# Patient Record
Sex: Male | Born: 1945 | Race: Black or African American | Hispanic: No | Marital: Married | Smoking: Former smoker
Health system: Southern US, Community
[De-identification: ages and names within clinical notes are randomized; demographics above are authoritative.]

## PROBLEM LIST (undated history)

## (undated) DIAGNOSIS — I10 Essential (primary) hypertension: Secondary | ICD-10-CM

## (undated) DIAGNOSIS — E119 Type 2 diabetes mellitus without complications: Secondary | ICD-10-CM

## (undated) DIAGNOSIS — F4310 Post-traumatic stress disorder, unspecified: Secondary | ICD-10-CM

## (undated) DIAGNOSIS — I4891 Unspecified atrial fibrillation: Secondary | ICD-10-CM

## (undated) DIAGNOSIS — N529 Male erectile dysfunction, unspecified: Secondary | ICD-10-CM

## (undated) DIAGNOSIS — R911 Solitary pulmonary nodule: Secondary | ICD-10-CM

## (undated) HISTORY — DX: Post-traumatic stress disorder, unspecified: F43.10

## (undated) HISTORY — DX: Type 2 diabetes mellitus without complications: E11.9

## (undated) HISTORY — DX: Essential (primary) hypertension: I10

## (undated) HISTORY — DX: Unspecified atrial fibrillation: I48.91

## (undated) HISTORY — DX: Male erectile dysfunction, unspecified: N52.9

## (undated) HISTORY — DX: Solitary pulmonary nodule: R91.1

---

## 2016-06-21 DIAGNOSIS — R739 Hyperglycemia, unspecified: Secondary | ICD-10-CM | POA: Diagnosis not present

## 2016-06-21 DIAGNOSIS — J209 Acute bronchitis, unspecified: Secondary | ICD-10-CM | POA: Diagnosis not present

## 2016-06-21 DIAGNOSIS — R0600 Dyspnea, unspecified: Secondary | ICD-10-CM | POA: Diagnosis not present

## 2016-06-21 DIAGNOSIS — I10 Essential (primary) hypertension: Secondary | ICD-10-CM | POA: Diagnosis not present

## 2016-06-21 DIAGNOSIS — E871 Hypo-osmolality and hyponatremia: Secondary | ICD-10-CM | POA: Diagnosis not present

## 2016-06-21 DIAGNOSIS — E119 Type 2 diabetes mellitus without complications: Secondary | ICD-10-CM | POA: Diagnosis not present

## 2016-06-21 DIAGNOSIS — Z0131 Encounter for examination of blood pressure with abnormal findings: Secondary | ICD-10-CM | POA: Diagnosis not present

## 2016-06-21 DIAGNOSIS — I4891 Unspecified atrial fibrillation: Secondary | ICD-10-CM | POA: Diagnosis not present

## 2016-06-21 DIAGNOSIS — Z7901 Long term (current) use of anticoagulants: Secondary | ICD-10-CM | POA: Diagnosis not present

## 2016-06-21 DIAGNOSIS — J019 Acute sinusitis, unspecified: Secondary | ICD-10-CM | POA: Diagnosis not present

## 2016-06-21 DIAGNOSIS — R350 Frequency of micturition: Secondary | ICD-10-CM | POA: Diagnosis not present

## 2016-06-21 DIAGNOSIS — Z7984 Long term (current) use of oral hypoglycemic drugs: Secondary | ICD-10-CM | POA: Diagnosis not present

## 2016-06-21 DIAGNOSIS — R000 Tachycardia, unspecified: Secondary | ICD-10-CM | POA: Diagnosis not present

## 2016-06-21 DIAGNOSIS — Z79899 Other long term (current) drug therapy: Secondary | ICD-10-CM | POA: Diagnosis not present

## 2016-06-21 DIAGNOSIS — R0602 Shortness of breath: Secondary | ICD-10-CM | POA: Diagnosis not present

## 2016-12-06 DIAGNOSIS — I10 Essential (primary) hypertension: Secondary | ICD-10-CM | POA: Diagnosis not present

## 2016-12-06 DIAGNOSIS — E109 Type 1 diabetes mellitus without complications: Secondary | ICD-10-CM | POA: Diagnosis not present

## 2016-12-06 DIAGNOSIS — R05 Cough: Secondary | ICD-10-CM | POA: Diagnosis not present

## 2017-01-03 DIAGNOSIS — I4891 Unspecified atrial fibrillation: Secondary | ICD-10-CM | POA: Diagnosis not present

## 2017-01-03 DIAGNOSIS — R05 Cough: Secondary | ICD-10-CM | POA: Diagnosis not present

## 2017-01-03 DIAGNOSIS — I10 Essential (primary) hypertension: Secondary | ICD-10-CM | POA: Diagnosis not present

## 2017-01-03 DIAGNOSIS — K219 Gastro-esophageal reflux disease without esophagitis: Secondary | ICD-10-CM | POA: Diagnosis not present

## 2017-01-03 DIAGNOSIS — E1142 Type 2 diabetes mellitus with diabetic polyneuropathy: Secondary | ICD-10-CM | POA: Diagnosis not present

## 2017-01-07 DIAGNOSIS — R1310 Dysphagia, unspecified: Secondary | ICD-10-CM | POA: Diagnosis not present

## 2017-01-07 DIAGNOSIS — R05 Cough: Secondary | ICD-10-CM | POA: Diagnosis not present

## 2017-01-18 DIAGNOSIS — K2900 Acute gastritis without bleeding: Secondary | ICD-10-CM | POA: Diagnosis not present

## 2017-01-18 DIAGNOSIS — I4891 Unspecified atrial fibrillation: Secondary | ICD-10-CM | POA: Diagnosis not present

## 2017-01-18 DIAGNOSIS — R499 Unspecified voice and resonance disorder: Secondary | ICD-10-CM | POA: Diagnosis not present

## 2017-01-18 DIAGNOSIS — R1310 Dysphagia, unspecified: Secondary | ICD-10-CM | POA: Diagnosis not present

## 2017-01-18 DIAGNOSIS — R05 Cough: Secondary | ICD-10-CM | POA: Diagnosis not present

## 2017-01-18 DIAGNOSIS — I10 Essential (primary) hypertension: Secondary | ICD-10-CM | POA: Diagnosis not present

## 2017-01-18 DIAGNOSIS — Z888 Allergy status to other drugs, medicaments and biological substances status: Secondary | ICD-10-CM | POA: Diagnosis not present

## 2017-01-18 DIAGNOSIS — K2950 Unspecified chronic gastritis without bleeding: Secondary | ICD-10-CM | POA: Diagnosis not present

## 2017-01-18 DIAGNOSIS — E119 Type 2 diabetes mellitus without complications: Secondary | ICD-10-CM | POA: Diagnosis not present

## 2017-01-18 DIAGNOSIS — K117 Disturbances of salivary secretion: Secondary | ICD-10-CM | POA: Diagnosis not present

## 2017-01-18 DIAGNOSIS — Z79899 Other long term (current) drug therapy: Secondary | ICD-10-CM | POA: Diagnosis not present

## 2017-01-18 DIAGNOSIS — K219 Gastro-esophageal reflux disease without esophagitis: Secondary | ICD-10-CM | POA: Diagnosis not present

## 2017-01-18 DIAGNOSIS — R062 Wheezing: Secondary | ICD-10-CM | POA: Diagnosis not present

## 2017-01-18 DIAGNOSIS — Z7902 Long term (current) use of antithrombotics/antiplatelets: Secondary | ICD-10-CM | POA: Diagnosis not present

## 2017-01-18 DIAGNOSIS — K2970 Gastritis, unspecified, without bleeding: Secondary | ICD-10-CM | POA: Diagnosis not present

## 2017-01-18 DIAGNOSIS — Z794 Long term (current) use of insulin: Secondary | ICD-10-CM | POA: Diagnosis not present

## 2017-02-04 DIAGNOSIS — R05 Cough: Secondary | ICD-10-CM | POA: Diagnosis not present

## 2017-02-04 DIAGNOSIS — K2900 Acute gastritis without bleeding: Secondary | ICD-10-CM | POA: Diagnosis not present

## 2017-02-12 DIAGNOSIS — G4730 Sleep apnea, unspecified: Secondary | ICD-10-CM | POA: Diagnosis not present

## 2017-02-12 DIAGNOSIS — I4892 Unspecified atrial flutter: Secondary | ICD-10-CM | POA: Diagnosis not present

## 2017-02-12 DIAGNOSIS — E119 Type 2 diabetes mellitus without complications: Secondary | ICD-10-CM | POA: Diagnosis not present

## 2017-02-12 DIAGNOSIS — I10 Essential (primary) hypertension: Secondary | ICD-10-CM | POA: Diagnosis not present

## 2017-02-12 DIAGNOSIS — T50905S Adverse effect of unspecified drugs, medicaments and biological substances, sequela: Secondary | ICD-10-CM | POA: Diagnosis not present

## 2017-02-12 DIAGNOSIS — Z7901 Long term (current) use of anticoagulants: Secondary | ICD-10-CM | POA: Diagnosis not present

## 2017-02-14 DIAGNOSIS — R05 Cough: Secondary | ICD-10-CM | POA: Diagnosis not present

## 2017-02-20 DIAGNOSIS — I4892 Unspecified atrial flutter: Secondary | ICD-10-CM | POA: Diagnosis not present

## 2017-02-20 DIAGNOSIS — I10 Essential (primary) hypertension: Secondary | ICD-10-CM | POA: Diagnosis not present

## 2017-03-06 DIAGNOSIS — I10 Essential (primary) hypertension: Secondary | ICD-10-CM | POA: Diagnosis not present

## 2017-03-12 DIAGNOSIS — I4891 Unspecified atrial fibrillation: Secondary | ICD-10-CM | POA: Diagnosis not present

## 2017-03-12 DIAGNOSIS — E1142 Type 2 diabetes mellitus with diabetic polyneuropathy: Secondary | ICD-10-CM | POA: Diagnosis not present

## 2017-03-12 DIAGNOSIS — I10 Essential (primary) hypertension: Secondary | ICD-10-CM | POA: Diagnosis not present

## 2017-03-29 DIAGNOSIS — R9431 Abnormal electrocardiogram [ECG] [EKG]: Secondary | ICD-10-CM | POA: Diagnosis not present

## 2017-03-29 DIAGNOSIS — I4891 Unspecified atrial fibrillation: Secondary | ICD-10-CM | POA: Diagnosis not present

## 2017-03-29 DIAGNOSIS — I10 Essential (primary) hypertension: Secondary | ICD-10-CM | POA: Diagnosis not present

## 2017-03-29 DIAGNOSIS — R002 Palpitations: Secondary | ICD-10-CM | POA: Diagnosis not present

## 2017-03-29 DIAGNOSIS — R600 Localized edema: Secondary | ICD-10-CM | POA: Diagnosis not present

## 2017-03-29 DIAGNOSIS — I4892 Unspecified atrial flutter: Secondary | ICD-10-CM | POA: Diagnosis not present

## 2017-03-29 DIAGNOSIS — F329 Major depressive disorder, single episode, unspecified: Secondary | ICD-10-CM | POA: Diagnosis not present

## 2017-03-29 DIAGNOSIS — I4439 Other atrioventricular block: Secondary | ICD-10-CM | POA: Diagnosis not present

## 2017-04-11 DIAGNOSIS — R351 Nocturia: Secondary | ICD-10-CM | POA: Diagnosis not present

## 2017-04-11 DIAGNOSIS — E119 Type 2 diabetes mellitus without complications: Secondary | ICD-10-CM | POA: Diagnosis not present

## 2017-04-11 DIAGNOSIS — J209 Acute bronchitis, unspecified: Secondary | ICD-10-CM | POA: Diagnosis not present

## 2017-04-11 DIAGNOSIS — R490 Dysphonia: Secondary | ICD-10-CM | POA: Diagnosis not present

## 2017-04-11 DIAGNOSIS — J019 Acute sinusitis, unspecified: Secondary | ICD-10-CM | POA: Diagnosis not present

## 2017-04-11 DIAGNOSIS — I10 Essential (primary) hypertension: Secondary | ICD-10-CM | POA: Diagnosis not present

## 2017-05-08 DIAGNOSIS — K219 Gastro-esophageal reflux disease without esophagitis: Secondary | ICD-10-CM | POA: Diagnosis not present

## 2017-05-08 DIAGNOSIS — G4733 Obstructive sleep apnea (adult) (pediatric): Secondary | ICD-10-CM | POA: Diagnosis not present

## 2017-05-24 DIAGNOSIS — I4892 Unspecified atrial flutter: Secondary | ICD-10-CM | POA: Diagnosis not present

## 2017-05-24 DIAGNOSIS — Z79899 Other long term (current) drug therapy: Secondary | ICD-10-CM | POA: Diagnosis not present

## 2017-05-24 DIAGNOSIS — Z01818 Encounter for other preprocedural examination: Secondary | ICD-10-CM | POA: Diagnosis not present

## 2017-05-29 DIAGNOSIS — Z794 Long term (current) use of insulin: Secondary | ICD-10-CM | POA: Diagnosis not present

## 2017-05-29 DIAGNOSIS — E119 Type 2 diabetes mellitus without complications: Secondary | ICD-10-CM | POA: Diagnosis not present

## 2017-05-29 DIAGNOSIS — Z6838 Body mass index (BMI) 38.0-38.9, adult: Secondary | ICD-10-CM | POA: Diagnosis not present

## 2017-05-29 DIAGNOSIS — E669 Obesity, unspecified: Secondary | ICD-10-CM | POA: Diagnosis not present

## 2017-05-29 DIAGNOSIS — I97618 Postprocedural hemorrhage and hematoma of a circulatory system organ or structure following other circulatory system procedure: Secondary | ICD-10-CM | POA: Diagnosis not present

## 2017-05-29 DIAGNOSIS — I483 Typical atrial flutter: Secondary | ICD-10-CM | POA: Diagnosis not present

## 2017-05-29 DIAGNOSIS — I4892 Unspecified atrial flutter: Secondary | ICD-10-CM | POA: Diagnosis not present

## 2017-05-29 DIAGNOSIS — I10 Essential (primary) hypertension: Secondary | ICD-10-CM | POA: Diagnosis not present

## 2017-05-29 DIAGNOSIS — F329 Major depressive disorder, single episode, unspecified: Secondary | ICD-10-CM | POA: Diagnosis not present

## 2017-05-29 DIAGNOSIS — Z87891 Personal history of nicotine dependence: Secondary | ICD-10-CM | POA: Diagnosis not present

## 2017-05-29 DIAGNOSIS — I4891 Unspecified atrial fibrillation: Secondary | ICD-10-CM | POA: Diagnosis not present

## 2017-05-29 DIAGNOSIS — G4730 Sleep apnea, unspecified: Secondary | ICD-10-CM | POA: Diagnosis not present

## 2017-05-29 DIAGNOSIS — Z7901 Long term (current) use of anticoagulants: Secondary | ICD-10-CM | POA: Diagnosis not present

## 2017-05-29 DIAGNOSIS — R9431 Abnormal electrocardiogram [ECG] [EKG]: Secondary | ICD-10-CM | POA: Diagnosis not present

## 2017-05-30 DIAGNOSIS — I4891 Unspecified atrial fibrillation: Secondary | ICD-10-CM | POA: Diagnosis not present

## 2017-05-30 DIAGNOSIS — F329 Major depressive disorder, single episode, unspecified: Secondary | ICD-10-CM | POA: Diagnosis not present

## 2017-05-30 DIAGNOSIS — Z7901 Long term (current) use of anticoagulants: Secondary | ICD-10-CM | POA: Diagnosis not present

## 2017-05-30 DIAGNOSIS — I10 Essential (primary) hypertension: Secondary | ICD-10-CM | POA: Diagnosis not present

## 2017-05-30 DIAGNOSIS — I97618 Postprocedural hemorrhage and hematoma of a circulatory system organ or structure following other circulatory system procedure: Secondary | ICD-10-CM | POA: Diagnosis not present

## 2017-05-30 DIAGNOSIS — I483 Typical atrial flutter: Secondary | ICD-10-CM | POA: Diagnosis not present

## 2017-06-01 DIAGNOSIS — I4891 Unspecified atrial fibrillation: Secondary | ICD-10-CM | POA: Diagnosis not present

## 2017-06-01 DIAGNOSIS — Z9889 Other specified postprocedural states: Secondary | ICD-10-CM | POA: Diagnosis not present

## 2017-06-01 DIAGNOSIS — R945 Abnormal results of liver function studies: Secondary | ICD-10-CM | POA: Diagnosis not present

## 2017-06-01 DIAGNOSIS — R911 Solitary pulmonary nodule: Secondary | ICD-10-CM | POA: Diagnosis not present

## 2017-06-01 DIAGNOSIS — Z7901 Long term (current) use of anticoagulants: Secondary | ICD-10-CM | POA: Diagnosis not present

## 2017-06-01 DIAGNOSIS — I10 Essential (primary) hypertension: Secondary | ICD-10-CM | POA: Diagnosis not present

## 2017-06-01 DIAGNOSIS — E119 Type 2 diabetes mellitus without complications: Secondary | ICD-10-CM | POA: Diagnosis not present

## 2017-06-01 DIAGNOSIS — Z794 Long term (current) use of insulin: Secondary | ICD-10-CM | POA: Diagnosis not present

## 2017-06-01 DIAGNOSIS — R140 Abdominal distension (gaseous): Secondary | ICD-10-CM | POA: Diagnosis not present

## 2017-06-01 DIAGNOSIS — R7989 Other specified abnormal findings of blood chemistry: Secondary | ICD-10-CM | POA: Diagnosis not present

## 2017-06-01 DIAGNOSIS — R062 Wheezing: Secondary | ICD-10-CM | POA: Diagnosis not present

## 2017-06-01 DIAGNOSIS — K5730 Diverticulosis of large intestine without perforation or abscess without bleeding: Secondary | ICD-10-CM | POA: Diagnosis not present

## 2017-06-01 DIAGNOSIS — R918 Other nonspecific abnormal finding of lung field: Secondary | ICD-10-CM | POA: Diagnosis not present

## 2017-06-01 DIAGNOSIS — R0602 Shortness of breath: Secondary | ICD-10-CM | POA: Diagnosis not present

## 2017-06-01 DIAGNOSIS — Z79899 Other long term (current) drug therapy: Secondary | ICD-10-CM | POA: Diagnosis not present

## 2017-06-05 DIAGNOSIS — I10 Essential (primary) hypertension: Secondary | ICD-10-CM | POA: Diagnosis not present

## 2017-06-05 DIAGNOSIS — Z7901 Long term (current) use of anticoagulants: Secondary | ICD-10-CM | POA: Diagnosis not present

## 2017-06-05 DIAGNOSIS — I4891 Unspecified atrial fibrillation: Secondary | ICD-10-CM | POA: Diagnosis not present

## 2017-06-05 DIAGNOSIS — I4892 Unspecified atrial flutter: Secondary | ICD-10-CM | POA: Diagnosis not present

## 2017-06-06 DIAGNOSIS — K224 Dyskinesia of esophagus: Secondary | ICD-10-CM | POA: Diagnosis not present

## 2017-06-06 DIAGNOSIS — K219 Gastro-esophageal reflux disease without esophagitis: Secondary | ICD-10-CM | POA: Diagnosis not present

## 2017-07-02 DIAGNOSIS — R490 Dysphonia: Secondary | ICD-10-CM | POA: Diagnosis not present

## 2017-07-08 DIAGNOSIS — Z7901 Long term (current) use of anticoagulants: Secondary | ICD-10-CM | POA: Diagnosis not present

## 2017-07-08 DIAGNOSIS — I4891 Unspecified atrial fibrillation: Secondary | ICD-10-CM | POA: Diagnosis not present

## 2017-07-08 DIAGNOSIS — G4730 Sleep apnea, unspecified: Secondary | ICD-10-CM | POA: Diagnosis not present

## 2017-07-08 DIAGNOSIS — I4892 Unspecified atrial flutter: Secondary | ICD-10-CM | POA: Diagnosis not present

## 2017-07-08 DIAGNOSIS — I10 Essential (primary) hypertension: Secondary | ICD-10-CM | POA: Diagnosis not present

## 2017-07-15 DIAGNOSIS — R490 Dysphonia: Secondary | ICD-10-CM | POA: Diagnosis not present

## 2017-07-15 DIAGNOSIS — J358 Other chronic diseases of tonsils and adenoids: Secondary | ICD-10-CM | POA: Diagnosis not present

## 2017-07-30 DIAGNOSIS — I10 Essential (primary) hypertension: Secondary | ICD-10-CM | POA: Diagnosis not present

## 2017-07-30 DIAGNOSIS — F4310 Post-traumatic stress disorder, unspecified: Secondary | ICD-10-CM | POA: Diagnosis not present

## 2017-07-30 DIAGNOSIS — R911 Solitary pulmonary nodule: Secondary | ICD-10-CM | POA: Diagnosis not present

## 2017-07-30 DIAGNOSIS — N529 Male erectile dysfunction, unspecified: Secondary | ICD-10-CM | POA: Diagnosis not present

## 2017-07-30 DIAGNOSIS — Z6838 Body mass index (BMI) 38.0-38.9, adult: Secondary | ICD-10-CM | POA: Diagnosis not present

## 2017-07-30 DIAGNOSIS — E119 Type 2 diabetes mellitus without complications: Secondary | ICD-10-CM | POA: Diagnosis not present

## 2017-07-30 DIAGNOSIS — I4891 Unspecified atrial fibrillation: Secondary | ICD-10-CM | POA: Diagnosis not present

## 2017-07-30 DIAGNOSIS — Z299 Encounter for prophylactic measures, unspecified: Secondary | ICD-10-CM | POA: Diagnosis not present

## 2017-08-21 DIAGNOSIS — I4891 Unspecified atrial fibrillation: Secondary | ICD-10-CM | POA: Diagnosis not present

## 2017-08-21 DIAGNOSIS — Z299 Encounter for prophylactic measures, unspecified: Secondary | ICD-10-CM | POA: Diagnosis not present

## 2017-08-21 DIAGNOSIS — E1165 Type 2 diabetes mellitus with hyperglycemia: Secondary | ICD-10-CM | POA: Diagnosis not present

## 2017-08-21 DIAGNOSIS — Z6838 Body mass index (BMI) 38.0-38.9, adult: Secondary | ICD-10-CM | POA: Diagnosis not present

## 2017-08-21 DIAGNOSIS — L259 Unspecified contact dermatitis, unspecified cause: Secondary | ICD-10-CM | POA: Diagnosis not present

## 2017-09-06 DIAGNOSIS — J3489 Other specified disorders of nose and nasal sinuses: Secondary | ICD-10-CM | POA: Diagnosis not present

## 2017-09-06 DIAGNOSIS — I4891 Unspecified atrial fibrillation: Secondary | ICD-10-CM | POA: Diagnosis not present

## 2017-09-06 DIAGNOSIS — E1165 Type 2 diabetes mellitus with hyperglycemia: Secondary | ICD-10-CM | POA: Diagnosis not present

## 2017-09-06 DIAGNOSIS — Z713 Dietary counseling and surveillance: Secondary | ICD-10-CM | POA: Diagnosis not present

## 2017-09-06 DIAGNOSIS — Z299 Encounter for prophylactic measures, unspecified: Secondary | ICD-10-CM | POA: Diagnosis not present

## 2017-10-01 DIAGNOSIS — Z299 Encounter for prophylactic measures, unspecified: Secondary | ICD-10-CM | POA: Diagnosis not present

## 2017-10-01 DIAGNOSIS — Z6837 Body mass index (BMI) 37.0-37.9, adult: Secondary | ICD-10-CM | POA: Diagnosis not present

## 2017-10-01 DIAGNOSIS — I4891 Unspecified atrial fibrillation: Secondary | ICD-10-CM | POA: Diagnosis not present

## 2017-10-01 DIAGNOSIS — D2339 Other benign neoplasm of skin of other parts of face: Secondary | ICD-10-CM | POA: Diagnosis not present

## 2017-10-01 DIAGNOSIS — E1165 Type 2 diabetes mellitus with hyperglycemia: Secondary | ICD-10-CM | POA: Diagnosis not present

## 2017-10-01 DIAGNOSIS — I10 Essential (primary) hypertension: Secondary | ICD-10-CM | POA: Diagnosis not present

## 2017-10-09 DIAGNOSIS — E119 Type 2 diabetes mellitus without complications: Secondary | ICD-10-CM | POA: Diagnosis not present

## 2017-10-09 DIAGNOSIS — T50905S Adverse effect of unspecified drugs, medicaments and biological substances, sequela: Secondary | ICD-10-CM | POA: Diagnosis not present

## 2017-10-09 DIAGNOSIS — I340 Nonrheumatic mitral (valve) insufficiency: Secondary | ICD-10-CM | POA: Diagnosis not present

## 2017-10-09 DIAGNOSIS — M79674 Pain in right toe(s): Secondary | ICD-10-CM | POA: Diagnosis not present

## 2017-10-09 DIAGNOSIS — G4730 Sleep apnea, unspecified: Secondary | ICD-10-CM | POA: Diagnosis not present

## 2017-10-09 DIAGNOSIS — Z7901 Long term (current) use of anticoagulants: Secondary | ICD-10-CM | POA: Diagnosis not present

## 2017-10-09 DIAGNOSIS — S91104A Unspecified open wound of right lesser toe(s) without damage to nail, initial encounter: Secondary | ICD-10-CM | POA: Diagnosis not present

## 2017-10-09 DIAGNOSIS — Z6837 Body mass index (BMI) 37.0-37.9, adult: Secondary | ICD-10-CM | POA: Diagnosis not present

## 2017-10-09 DIAGNOSIS — I4892 Unspecified atrial flutter: Secondary | ICD-10-CM | POA: Diagnosis not present

## 2017-10-09 DIAGNOSIS — I10 Essential (primary) hypertension: Secondary | ICD-10-CM | POA: Diagnosis not present

## 2017-10-16 DIAGNOSIS — L711 Rhinophyma: Secondary | ICD-10-CM | POA: Diagnosis not present

## 2017-10-16 DIAGNOSIS — M950 Acquired deformity of nose: Secondary | ICD-10-CM | POA: Diagnosis not present

## 2018-01-11 DIAGNOSIS — R319 Hematuria, unspecified: Secondary | ICD-10-CM | POA: Diagnosis not present

## 2018-01-28 ENCOUNTER — Ambulatory Visit: Payer: Self-pay

## 2018-01-28 ENCOUNTER — Other Ambulatory Visit: Payer: Self-pay | Admitting: Occupational Medicine

## 2018-01-28 DIAGNOSIS — Z0000 Encounter for general adult medical examination without abnormal findings: Secondary | ICD-10-CM

## 2018-03-11 DIAGNOSIS — Z7189 Other specified counseling: Secondary | ICD-10-CM | POA: Diagnosis not present

## 2018-03-11 DIAGNOSIS — I4891 Unspecified atrial fibrillation: Secondary | ICD-10-CM | POA: Diagnosis not present

## 2018-03-11 DIAGNOSIS — N529 Male erectile dysfunction, unspecified: Secondary | ICD-10-CM | POA: Diagnosis not present

## 2018-03-11 DIAGNOSIS — Z0000 Encounter for general adult medical examination without abnormal findings: Secondary | ICD-10-CM | POA: Diagnosis not present

## 2018-03-11 DIAGNOSIS — Z1211 Encounter for screening for malignant neoplasm of colon: Secondary | ICD-10-CM | POA: Diagnosis not present

## 2018-03-11 DIAGNOSIS — Z1339 Encounter for screening examination for other mental health and behavioral disorders: Secondary | ICD-10-CM | POA: Diagnosis not present

## 2018-03-11 DIAGNOSIS — E119 Type 2 diabetes mellitus without complications: Secondary | ICD-10-CM | POA: Diagnosis not present

## 2018-03-11 DIAGNOSIS — I10 Essential (primary) hypertension: Secondary | ICD-10-CM | POA: Diagnosis not present

## 2018-03-11 DIAGNOSIS — Z1331 Encounter for screening for depression: Secondary | ICD-10-CM | POA: Diagnosis not present

## 2018-03-11 DIAGNOSIS — Z299 Encounter for prophylactic measures, unspecified: Secondary | ICD-10-CM | POA: Diagnosis not present

## 2018-03-11 DIAGNOSIS — Z6837 Body mass index (BMI) 37.0-37.9, adult: Secondary | ICD-10-CM | POA: Diagnosis not present

## 2018-04-03 DIAGNOSIS — Z7901 Long term (current) use of anticoagulants: Secondary | ICD-10-CM | POA: Diagnosis not present

## 2018-04-03 DIAGNOSIS — E119 Type 2 diabetes mellitus without complications: Secondary | ICD-10-CM | POA: Diagnosis not present

## 2018-04-03 DIAGNOSIS — G4730 Sleep apnea, unspecified: Secondary | ICD-10-CM | POA: Diagnosis not present

## 2018-04-03 DIAGNOSIS — I10 Essential (primary) hypertension: Secondary | ICD-10-CM | POA: Diagnosis not present

## 2018-04-03 DIAGNOSIS — Z6837 Body mass index (BMI) 37.0-37.9, adult: Secondary | ICD-10-CM | POA: Diagnosis not present

## 2018-04-03 DIAGNOSIS — I340 Nonrheumatic mitral (valve) insufficiency: Secondary | ICD-10-CM | POA: Diagnosis not present

## 2018-04-03 DIAGNOSIS — T50905S Adverse effect of unspecified drugs, medicaments and biological substances, sequela: Secondary | ICD-10-CM | POA: Diagnosis not present

## 2018-04-03 DIAGNOSIS — I4892 Unspecified atrial flutter: Secondary | ICD-10-CM | POA: Diagnosis not present

## 2018-07-03 DIAGNOSIS — I10 Essential (primary) hypertension: Secondary | ICD-10-CM | POA: Diagnosis not present

## 2018-07-03 DIAGNOSIS — Z6838 Body mass index (BMI) 38.0-38.9, adult: Secondary | ICD-10-CM | POA: Diagnosis not present

## 2018-07-03 DIAGNOSIS — R911 Solitary pulmonary nodule: Secondary | ICD-10-CM | POA: Diagnosis not present

## 2018-07-03 DIAGNOSIS — Z299 Encounter for prophylactic measures, unspecified: Secondary | ICD-10-CM | POA: Diagnosis not present

## 2018-07-03 DIAGNOSIS — E1165 Type 2 diabetes mellitus with hyperglycemia: Secondary | ICD-10-CM | POA: Diagnosis not present

## 2018-07-03 DIAGNOSIS — G4730 Sleep apnea, unspecified: Secondary | ICD-10-CM | POA: Diagnosis not present

## 2018-07-03 DIAGNOSIS — R51 Headache: Secondary | ICD-10-CM | POA: Diagnosis not present

## 2018-08-21 ENCOUNTER — Telehealth: Payer: Self-pay | Admitting: Neurology

## 2018-08-21 NOTE — Telephone Encounter (Signed)
Called the patient to inform them that our office has placed new protocols in place for our office visits. Due to Covid 19 our office is reducing our number of office visits in order to minimize the risk to our patients and healthcare providers.Our office is now providing the capability to offer the patients virtual visits at this time. Thre was no answer. LVM informing the pt to call back.

## 2018-08-27 ENCOUNTER — Institutional Professional Consult (permissible substitution): Payer: Self-pay | Admitting: Neurology

## 2018-09-17 DIAGNOSIS — Z6838 Body mass index (BMI) 38.0-38.9, adult: Secondary | ICD-10-CM | POA: Diagnosis not present

## 2018-09-17 DIAGNOSIS — I10 Essential (primary) hypertension: Secondary | ICD-10-CM | POA: Diagnosis not present

## 2018-09-17 DIAGNOSIS — Z299 Encounter for prophylactic measures, unspecified: Secondary | ICD-10-CM | POA: Diagnosis not present

## 2018-09-17 DIAGNOSIS — I4891 Unspecified atrial fibrillation: Secondary | ICD-10-CM | POA: Diagnosis not present

## 2018-09-17 DIAGNOSIS — E1165 Type 2 diabetes mellitus with hyperglycemia: Secondary | ICD-10-CM | POA: Diagnosis not present

## 2018-09-17 DIAGNOSIS — G4730 Sleep apnea, unspecified: Secondary | ICD-10-CM | POA: Diagnosis not present

## 2018-09-29 DIAGNOSIS — B351 Tinea unguium: Secondary | ICD-10-CM | POA: Diagnosis not present

## 2018-09-29 DIAGNOSIS — M79673 Pain in unspecified foot: Secondary | ICD-10-CM | POA: Diagnosis not present

## 2018-09-29 DIAGNOSIS — E119 Type 2 diabetes mellitus without complications: Secondary | ICD-10-CM | POA: Diagnosis not present

## 2018-10-08 DIAGNOSIS — G4730 Sleep apnea, unspecified: Secondary | ICD-10-CM | POA: Diagnosis not present

## 2018-10-08 DIAGNOSIS — I10 Essential (primary) hypertension: Secondary | ICD-10-CM | POA: Diagnosis not present

## 2018-10-08 DIAGNOSIS — I4891 Unspecified atrial fibrillation: Secondary | ICD-10-CM | POA: Diagnosis not present

## 2018-10-08 DIAGNOSIS — I340 Nonrheumatic mitral (valve) insufficiency: Secondary | ICD-10-CM | POA: Diagnosis not present

## 2018-10-21 ENCOUNTER — Other Ambulatory Visit: Payer: Self-pay

## 2018-10-21 ENCOUNTER — Ambulatory Visit (INDEPENDENT_AMBULATORY_CARE_PROVIDER_SITE_OTHER): Payer: Medicare Other | Admitting: Internal Medicine

## 2018-10-21 ENCOUNTER — Encounter: Payer: Self-pay | Admitting: Internal Medicine

## 2018-10-21 DIAGNOSIS — R918 Other nonspecific abnormal finding of lung field: Secondary | ICD-10-CM | POA: Diagnosis not present

## 2018-10-21 DIAGNOSIS — J45991 Cough variant asthma: Secondary | ICD-10-CM | POA: Insufficient documentation

## 2018-10-21 NOTE — Progress Notes (Signed)
Justin Sanford, male    DOB: Dec 20, 1945,    MRN: [id number]   Brief patient profile:  72 yobm quit smoking 1988 exposed to dust at [facility] center x first two days and developed cough/wheeze on site  > saw doctor in [place] and took an inhaler for a few days and avoided further exposure  With no further symptoms at all until 2018 in [facility] similar symptoms > ER in [place] > "bunch of tests" >  rec proair went to [facility] > f/u been arranged for sponts on the lung and last used saba around 1st 2020 and referred to pulmonary clinic 10/21/2018 by Dr   Woody Seller for eval of doe no better with trelegy     History of Present Illness  10/21/2018  Pulmonary/ 1st office eval/Svetlana Bagby  Chief Complaint  Patient presents with  . Pulmonary Consult    Referred by Dr. Woody Seller.   Dyspnea:  Denies at present being limited by breathing from desired activities  (not the same hx he gave Dr Woody Seller 09/17/18 Cough: minimal dry / sporadic day > noct Sleep: was on cpap stopped p lost wt, denies issues with sleeping/feels fine in am  or excessive daytime sleepiness  SABA use: none in months   No obvious day to day or daytime variability or assoc excess/ purulent sputum or mucus plugs or hemoptysis or cp or chest tightness, subjective wheeze or overt sinus or hb symptoms.   Sleeping  without nocturnal  or early am exacerbation  of respiratory  c/o's or need for noct saba. Also denies any obvious fluctuation of symptoms with weather or environmental changes or other aggravating or alleviating factors except as outlined above   No unusual exposure hx or h/o childhood pna/ asthma or knowledge of premature birth.  Current Allergies, Complete Past Medical History, Past Surgical History, Family History, and Social History were reviewed in [employer] [employer] record.  ROS  The following are not active complaints unless bolded Hoarseness, sore throat, dysphagia, dental problems, itching, sneezing,  nasal congestion or  discharge of excess mucus or purulent secretions, ear ache,   fever, chills, sweats, unintended wt loss or wt gain, classically pleuritic or exertional cp,  orthopnea pnd or arm/hand swelling  or leg swelling, presyncope, palpitations, abdominal pain, anorexia, nausea, vomiting, diarrhea  or change in bowel habits or change in bladder habits, change in stools or change in urine, dysuria, hematuria,  rash, arthralgias, visual complaints, headache, numbness, weakness or ataxia or problems with walking or coordination,  change in mood or  memory.          No past medical history on file.  Outpatient Medications Prior to Visit  Medication Sig Dispense Refill  . Insulin Aspart (NOVOLOG [place]) As directed    . losartan (COZAAR) 100 MG tablet Take 1 tablet by mouth daily.    . metFORMIN (GLUCOPHAGE) 1000 MG tablet Take 1,000 mg by mouth 2 (two) times a day.    . metoprolol tartrate (LOPRESSOR) 50 MG tablet Take 1 tablet by mouth 2 (two) times a day.        Objective:     BP 112/66 (BP Location: Left Arm, Cuff Size: Normal)   Pulse 60   Temp 98.3 F (36.8 C) (Oral)   Ht 5\' 11"  (1.803 m)   Wt 247 lb (112 kg)   SpO2 100%   BMI 34.45 kg/m   SpO2: 100 % RA  Stoic bm/ very unusual responses to questions re symptoms, prefers  to tell me what other doctors think is wrong rather than answer any specific symptom related questions now, which he entirely downplays   HEENT: nl dentition, turbinates bilaterally, and oropharynx. Nl external ear canals without cough reflex   NECK :  without JVD/Nodes/TM/ nl carotid upstrokes bilaterally   LUNGS: no acc muscle use,  Nl contour chest which is clear to A and P bilaterally without cough on insp or exp maneuvers   CV:  RRR  no s3 or murmur or increase in P2, and no edema   ABD:  soft and nontender with nl inspiratory excursion in the supine position. No bruits or organomegaly appreciated, bowel sounds nl  MS:  Nl gait/ ext warm without deformities,  calf tenderness, cyanosis or clubbing No obvious joint restrictions   SKIN: warm and dry without lesions    NEURO:  alert, approp, nl sensorium with  no motor or cerebellar deficits apparent.     CT chest 04/17/18 Resolution of some nodules, no change in other s x Sep 03 2016  [facility]                      Assessment   No problem-specific Assessment & Plan notes found for this encounter.     Christinia Gully, MD 10/21/2018

## 2018-10-21 NOTE — Patient Instructions (Addendum)
Only use your albuterol as a rescue medication to be used if you can't catch your breath by resting or doing a relaxed purse lip breathing pattern.  - The less you use it, the better it will work when you need it. - Ok to use up to 2 puffs  every 4 hours if you must but call for immediate appointment if use goes up over your usual need - Don't leave home without it !!  (think of it like the spare tire for your car)    Work on inhaler technique:  relax and gently blow all the way out then take a nice smooth deep breath back in, triggering the inhaler at same time you start breathing in.   Blow out thru nose. Rinse and gargle with water when done  [facility] - if you desire for [place] to start following you for this problem we will need to see you with all your scans / records in hand    Pulmonary follow up is as needed

## 2018-10-22 ENCOUNTER — Encounter: Payer: Self-pay | Admitting: Internal Medicine

## 2018-10-22 DIAGNOSIS — R918 Other nonspecific abnormal finding of lung field: Secondary | ICD-10-CM | POA: Insufficient documentation

## 2018-10-22 NOTE — Assessment & Plan Note (Signed)
Onset 12/1999 p [facility]TC exposure - rx as of 10/21/2018 = prn saba only   - The proper method of use, as well as anticipated side effects, of a metered-dose inhaler are discussed and demonstrated to the patient.  **    If this asthma at all,  All goals of chronic asthma control met including optimal function and elimination of symptoms with minimal need for rescue therapy.  Contingencies discussed in full including contacting this office immediately if not controlling the symptoms using the rule of two's as follows: >>>  If your breathing worsens or you need to use your rescue inhaler more than twice weekly or wake up more than twice a month with any respiratory symptoms or require more than two rescue inhalers per year, we need to see you right away because this means we're not controlling the underlying problem (inflammation) adequately.  Rescue inhalers (albuterol) do not control inflammation and overuse can lead to unnecessary and costly consequences.  They can make you feel better temporarily but eventually they will quit working effectively much as sleep aids lead to more insomnia if used regularly.        >>> If he starts having doe again or breaks the rule of 2's, I've encouraged him to return right away for re-eval which will include full pfts and perhaps MCT or cpst once  COVID - 19 restrictions have been lifted.

## 2018-10-22 NOTE — Assessment & Plan Note (Signed)
Exp to [facility] dust 12/2009 CT chest 04/17/18 Resolution of some nodules, no change in other s x Sep 03 2016  [facility]  Can follow up here [facility] not [facility] but explained best to let one center follow this problem so it doesn't fall thru the cracks   Total time devoted to counseling  > 50 % of initial 45 min office visit:  review case with pt/ discussion of options/alternatives/ personally creating written customized instructions  in presence of pt  then going over those specific  Instructions directly with the pt including how to use all of the meds but in particular covering each new medication in detail and the difference between the maintenance= "automatic" meds and the prns using an action plan format for the latter (If this problem/symptom => do that organization reading Left to right).  Please see AVS from this visit for a full list of these instructions which I personally wrote for this pt and  are unique to this visit.

## 2018-11-04 ENCOUNTER — Encounter: Payer: Self-pay | Admitting: Neurology

## 2018-11-05 ENCOUNTER — Ambulatory Visit (INDEPENDENT_AMBULATORY_CARE_PROVIDER_SITE_OTHER): Payer: Medicare Other | Admitting: Neurology

## 2018-11-05 ENCOUNTER — Other Ambulatory Visit: Payer: Self-pay

## 2018-11-05 ENCOUNTER — Encounter: Payer: Self-pay | Admitting: Neurology

## 2018-11-05 VITALS — BP 133/77 | HR 51 | Temp 97.5°F | Ht 71.0 in | Wt 251.0 lb

## 2018-11-05 DIAGNOSIS — R918 Other nonspecific abnormal finding of lung field: Secondary | ICD-10-CM | POA: Diagnosis not present

## 2018-11-05 DIAGNOSIS — G4733 Obstructive sleep apnea (adult) (pediatric): Secondary | ICD-10-CM | POA: Diagnosis not present

## 2018-11-05 DIAGNOSIS — Z9989 Dependence on other enabling machines and devices: Secondary | ICD-10-CM | POA: Diagnosis not present

## 2018-11-05 DIAGNOSIS — K219 Gastro-esophageal reflux disease without esophagitis: Secondary | ICD-10-CM | POA: Diagnosis not present

## 2018-11-05 DIAGNOSIS — J45991 Cough variant asthma: Secondary | ICD-10-CM | POA: Diagnosis not present

## 2018-11-05 NOTE — Progress Notes (Signed)
SLEEP MEDICINE CLINIC    Provider:  Larey Seat, MD  Primary Care Physician:  Glenda Chroman, MD Justin Sanford [id number]     Referring Provider: Glenda Chroman, Justin Sanford,  Justin Sanford [id number]          Chief Complaint according to patient   Patient presents with:    . New Patient (Initial Visit)           HISTORY OF PRESENT ILLNESS:  Justin Sanford is a 73 y.o. year old 47 or African American male patient seen hereupon referral by Dr. Woody Seller on 11/05/2018 . The key compliant for the sleep clinic is " hypersomnia.'  Chief concern according to patient :"  I am no longer sleepy, I changed my diet and lifestyle and lost weight, haven't used my CPAP since memorial day".    I have the pleasure of seeing Justin Sanford today, a right -handed Black or [place] American male with a possible sleep disorder.  He  has a  has a past medical history of Atrial fibrillation ([place]), Diabetes mellitus without complication ([place]), Hypertension, Impotence, Nodule of left lung, and PTSD (post-traumatic stress disorder). Obesity , BMI 35 .  He was treated for OSA on CPAP.    The patient had the first sleep study in the year 2006 in [place], [address], likely associated with beth [place]. The next study was in 2015 at [place]  in [place].  he used CPAP after the first study. He has ben very heavy , about 280 pounds.  Sleep relevant medical history: Nocturia: yes - 2-3 times.    Family medical /sleep history: no other family member on CPAP with OSA, insomnia, sleep walkers.  Parents ; father died age 57 with emphysema- was a smoker, SOB, and mother at 12 of an aneurysm.  DM affects 3 siblings, HTN , too. He has 9 living siblings, was one of 49.    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  and lives in a household with 2 persons. Family status is married..  Pets are not  present. Tobacco use: remote , quit in 1995.   ETOH use - socially,  had been for the last 2 years.  Caffeine intake in form of Coffee( decaffeinated  ) Soda( none ) Tea ( non caffeineated) or energy drinks. Regular exercise: none    Sleep habits are as follows: The patient's dinner time is between 5-7 PM.  The patient goes to bed at 10- 11 PM and transfers to the bedroom, which is cool, quiet and dark.  continues to sleep for 2-3 hours, wakes for  bathroom breaks, the first time at 2 AM.   The preferred sleep position is supine or on his side , with the support of 2 pillows.  Dreams are reportedly rare, not vivid.   The patient wakes up spontaneously at 5.30 AM , stays in be for another hour and rises by 7 AM. .  He reports  feeling refreshed or restored in AM, without symptoms such as dry mouth or morning headaches, and residual fatigue.  Naps are taken very infrequently, lasting from 30 to 60 minutes.     Review of Systems: Out of a complete 14 system review, the patient complains of only the following symptoms, and all other reviewed systems are negative.:  Fatigue, sleepiness , snoring, fragmented sleep, nocturia.   How likely are you to doze  in the following situations: 0 = not likely, 1 = slight chance, 2 = moderate chance, 3 = high chance   Sitting and Reading? Watching Television? Sitting inactive in a public place (theater or meeting)? As a passenger in a car for an hour without a break? Lying down in the afternoon when circumstances permit? Sitting and talking to someone? Sitting quietly after lunch without alcohol? In a car, while stopped for a few minutes in traffic?   Total = 8/ 24 points   FSS endorsed at 23/ 63 points.    He used to sleep all the time- until 6- 8 weeks ago when he lost weight and changed his diet to a alkaline plant based diet. .   Social History   Socioeconomic History  . Marital status: Married    Spouse name: Not on file  . Number of children: Not on file  . Years of education: Not on file  . Highest  education level: Not on file  Occupational History  . Not on file  Social Needs  . Financial resource strain: Not on file  . Food insecurity    Worry: Not on file    Inability: Not on file  . Transportation needs    Medical: Not on file    Non-medical: Not on file  Tobacco Use  . Smoking status: Former Smoker    Packs/day: 2.00    Years: 20.00    Pack years: 40.00    Quit date: 04/30/1986    Years since quitting: 32.5  . Smokeless tobacco: Never Used  Substance and Sexual Activity  . Alcohol use: Not on file  . Drug use: Not on file  . Sexual activity: Not on file  Lifestyle  . Physical activity    Days per week: Not on file    Minutes per session: Not on file  . Stress: Not on file  Relationships  . Social Herbalist on phone: Not on file    Gets together: Not on file    Attends religious service: Not on file    Active member of club or organization: Not on file    Attends meetings of clubs or organizations: Not on file    Relationship status: Not on file  Other Topics Concern  . Not on file  Social History Narrative  . Not on file    No family history on file.  Past Medical History:  Diagnosis Date  . Atrial fibrillation (Justin Sanford)   . Diabetes mellitus without complication (Justin Sanford)    type 2  . Hypertension   . Impotence   . Nodule of left lung   . PTSD (post-traumatic stress disorder)   Obesity .   No past surgical history on file.   Current Outpatient Medications on File Prior to Visit  Medication Sig Dispense Refill  . Cholecalciferol (VITAMIN D3) 50 MCG (2000 UT) TABS Take 1 tablet by mouth daily.    Marland Kitchen glipiZIDE (GLUCOTROL) 5 MG tablet Take by mouth 2 (two) times daily before a meal.    . hydrochlorothiazide (HYDRODIURIL) 25 MG tablet Take 25 mg by mouth daily.    . Insulin Detemir (LEVEMIR FLEXPEN Justin Sanford) Inject 26 Units into the skin at bedtime.     . Insulin NPH, Human,, Isophane, (NOVOLIN N FLEXPEN) 100 UNIT/ML Justin Sanford Inject 8-10 Units into the  skin 3 (three) times daily. Based off sliding scale    . losartan (COZAAR) 50 MG tablet Take 50 mg by mouth daily.     Marland Kitchen  metFORMIN (GLUCOPHAGE) 1000 MG tablet Take 1,000 mg by mouth 2 (two) times a day.    . tadalafil (CIALIS) 20 MG tablet Take 20 mg by mouth daily as needed for erectile dysfunction.     No current facility-administered medications on file prior to visit.     Allergies  Allergen Reactions  . Ace Inhibitors Cough    Physical exam:  Today's Vitals   11/05/18 1259  BP: 133/77  Pulse: (!) 51  Temp: (!) 97.5 F (36.4 C)  Weight: 251 lb (113.9 kg)  Height: 5\' 11"  (1.803 m)   Body mass index is 35.01 kg/m.   Wt Readings from Last 3 Encounters:  11/05/18 251 lb (113.9 kg)  10/21/18 247 lb (112 kg)     Ht Readings from Last 3 Encounters:  11/05/18 5\' 11"  (1.803 m)  10/21/18 5\' 11"  (1.803 m)      General: The patient is awake, alert and appears not in acute distress. The patient is well groomed. Head: Normocephalic, atraumatic. Neck is supple. Mallampati: 2 (!)     neck circumference: 20 inches .  Nasal airflow patent.  Retrognathia is mildly seen.  Dental status: upper plate .  Cardiovascular:  Regular rate and cardiac rhythm by pulse,  without distended neck veins. Respiratory: Lungs are clear to auscultation.  Skin:  Without evidence of ankle edema, or rash. Trunk: The patient's posture is erect.   Neurologic exam : The patient is awake and alert, oriented to place and time.   Memory subjective described as intact.  Attention span & concentration ability appears normal.  Speech is fluent,  without  dysarthria, dysphonia or aphasia.  Mood and affect are appropriate.   Cranial nerves: no loss of smell or taste reported  Pupils are equal and briskly reactive to light. Funduscopic exam deferred.   Extraocular movements in vertical and horizontal planes were intact and without nystagmus. No Diplopia. Visual fields by finger perimetry are intact. Hearing  was intact to soft voice and finger rubbing.    Facial sensation intact to fine touch.  Facial motor strength is symmetric and tongue and uvula move midline.  Neck ROM : rotation, tilt and flexion extension were normal for age and shoulder shrug was symmetrical.    Motor exam:  Symmetric bulk, tone and ROM.   Normal tone without cog wheeling, symmetric grip strength .   Sensory:  Fine touch, pinprick and vibration were tested  and  normal.  Proprioception tested in the upper extremities was normal.   Coordination: Rapid alternating movements in the fingers/hands were of normal speed.  The Finger-to-nose maneuver was intact without evidence of ataxia, dysmetria or tremor.   Gait and station: Patient could rise unassisted from a seated position, walked without assistive device.     I reviewed the patients medication, allergies and recent [facility] based [facility]- he needs only 26 units of insulin, down from 45 units since dietary changes.   He has not used CPAP since May 28th 2020.      After spending a total time of  35   minutes face to face and additional time for physical and neurologic examination, review of laboratory studies,  personal review of imaging studies, reports and results of other testing and review of referral information / records as far as provided in visit, I have established the following assessments:  1)  The patient had been a compliant CPAP user for 15 years when he finally decided to change life style and  diet, and under Covid 19 has meant not going out, not eating out.  He lost weight and feels better- has been able to reduce the Insulin needed.  He also felt CPAP was not longer needed, and also the couple sleeps in different bedrooms she has assured him that he is currently  not snoring.   2)  No EDS, but nocturia.  3) no longer morning headaches.   4) The patient has a history of atrial fibrillation and was on Coumadin for years , 18 month ago he underwent an  ablation. Now NSR.   My Plan is to proceed with:   1) HST if possible , the patient feels CPAP is not longer needed, but dr Woody Seller would like a confirmatory sleep test. Since the patient lives in [place] and drives for one hour I would prefer a HST.   2) If HST is not permitted will perform in lab study.    I would like to thank  Glenda Chroman, Md [address] [place],   [address] for allowing me to meet with and to take care of this pleasant patient.   In short, Cashawn Yanko is presenting with improved sleepiness, improved headaches and sees no need to continue with CPAP. I plan to follow up either personally or through our NP within 2-3 month if he has apnea still. .   Electronically signed by: Larey Seat, MD 11/05/2018 1:16 PM  [facility] Sleep Board certified by The [employer] of Sleep Medicine and Diplomate of the [employer] of Sleep Medicine. Board certified In Neurology through the [place], Fellow of the [employer] of Neurology. Medical Director of [employer].

## 2018-11-05 NOTE — Patient Instructions (Addendum)
Sleep Apnea Sleep apnea affects breathing during sleep. It causes breathing to stop for a short time or to become shallow. It can also increase the risk of:  Heart attack.  Stroke.  Being very overweight (obese).  Diabetes.  Heart failure.  Irregular heartbeat. The goal of treatment is to help you breathe normally again. What are the causes? There are three kinds of sleep apnea:  Obstructive sleep apnea. This is caused by a blocked or collapsed airway.  Central sleep apnea. This happens when the brain does not send the right signals to the muscles that control breathing.  Mixed sleep apnea. This is a combination of obstructive and central sleep apnea. The most common cause of this condition is a collapsed or blocked airway. This can happen if:  Your throat muscles are too relaxed.  Your tongue and tonsils are too large.  You are overweight.  Your airway is too small. What increases the risk?  Being overweight.  Smoking.  Having a small airway.  Being older.  Being male.  Drinking alcohol.  Taking medicines to calm yourself (sedatives or tranquilizers).  Having family members with the condition. What are the signs or symptoms?  Trouble staying asleep.  Being sleepy or tired during the day.  Getting angry a lot.  Loud snoring.  Headaches in the morning.  Not being able to focus your mind (concentrate).  Forgetting things.  Less interest in sex.  Mood swings.  Personality changes.  Feelings of sadness (depression).  Waking up a lot during the night to pee (urinate).  Dry mouth.  Sore throat. How is this diagnosed?  Your medical history.  A physical exam.  A test that is done when you are sleeping (sleep study). The test is most often done in a sleep lab but may also be done at home. How is this treated?   Sleeping on your side.  Using a medicine to get rid of mucus in your nose (decongestant).  Avoiding the use of alcohol,  medicines to help you relax, or certain pain medicines (narcotics).  Losing weight, if needed.  Changing your diet.  Not smoking.  Using a machine to open your airway while you sleep, such as: ? An oral appliance. This is a mouthpiece that shifts your lower jaw forward. ? A CPAP device. This device blows air through a mask when you breathe out (exhale). ? An EPAP device. This has valves that you put in each nostril. ? A BPAP device. This device blows air through a mask when you breathe in (inhale) and breathe out.  Having surgery if other treatments do not work. It is important to get treatment for sleep apnea. Without treatment, it can lead to:  High blood pressure.  Coronary artery disease.  In men, not being able to have an erection (impotence).  Reduced thinking ability. Follow these instructions at home: Lifestyle  Make changes that your doctor recommends.  Eat a healthy diet.  Lose weight if needed.  Avoid alcohol, medicines to help you relax, and some pain medicines.  Do not use any products that contain nicotine or tobacco, such as cigarettes, e-cigarettes, and chewing tobacco. If you need help quitting, ask your doctor. General instructions  Take over-the-counter and prescription medicines only as told by your doctor.  If you were given a machine to use while you sleep, use it only as told by your doctor.  If you are having surgery, make sure to tell your doctor you have sleep apnea. You   may need to bring your device with you.  Keep all follow-up visits as told by your doctor. This is important. Contact a doctor if:  The machine that you were given to use during sleep bothers you or does not seem to be working.  You do not get better.  You get worse. Get help right away if:  Your chest hurts.  You have trouble breathing in enough air.  You have an uncomfortable feeling in your back, arms, or stomach.  You have trouble talking.  One side of your  body feels weak.  A part of your face is hanging down.

## 2018-12-09 ENCOUNTER — Ambulatory Visit: Payer: Medicare Other | Admitting: Neurology

## 2018-12-15 DIAGNOSIS — B351 Tinea unguium: Secondary | ICD-10-CM | POA: Diagnosis not present

## 2018-12-15 DIAGNOSIS — B353 Tinea pedis: Secondary | ICD-10-CM | POA: Diagnosis not present

## 2019-01-14 DIAGNOSIS — T50905S Adverse effect of unspecified drugs, medicaments and biological substances, sequela: Secondary | ICD-10-CM | POA: Diagnosis not present

## 2019-01-14 DIAGNOSIS — I4892 Unspecified atrial flutter: Secondary | ICD-10-CM | POA: Diagnosis not present

## 2019-01-14 DIAGNOSIS — I10 Essential (primary) hypertension: Secondary | ICD-10-CM | POA: Diagnosis not present

## 2019-01-14 DIAGNOSIS — G4730 Sleep apnea, unspecified: Secondary | ICD-10-CM | POA: Diagnosis not present

## 2019-01-14 DIAGNOSIS — E119 Type 2 diabetes mellitus without complications: Secondary | ICD-10-CM | POA: Diagnosis not present

## 2019-01-14 DIAGNOSIS — Z7901 Long term (current) use of anticoagulants: Secondary | ICD-10-CM | POA: Diagnosis not present

## 2019-01-14 DIAGNOSIS — I340 Nonrheumatic mitral (valve) insufficiency: Secondary | ICD-10-CM | POA: Diagnosis not present

## 2019-03-17 DIAGNOSIS — E7800 Pure hypercholesterolemia, unspecified: Secondary | ICD-10-CM | POA: Diagnosis not present

## 2019-03-17 DIAGNOSIS — I10 Essential (primary) hypertension: Secondary | ICD-10-CM | POA: Diagnosis not present

## 2019-03-17 DIAGNOSIS — R5383 Other fatigue: Secondary | ICD-10-CM | POA: Diagnosis not present

## 2019-03-17 DIAGNOSIS — Z7189 Other specified counseling: Secondary | ICD-10-CM | POA: Diagnosis not present

## 2019-03-17 DIAGNOSIS — E1165 Type 2 diabetes mellitus with hyperglycemia: Secondary | ICD-10-CM | POA: Diagnosis not present

## 2019-03-17 DIAGNOSIS — Z0000 Encounter for general adult medical examination without abnormal findings: Secondary | ICD-10-CM | POA: Diagnosis not present

## 2019-03-17 DIAGNOSIS — Z1211 Encounter for screening for malignant neoplasm of colon: Secondary | ICD-10-CM | POA: Diagnosis not present

## 2019-03-17 DIAGNOSIS — Z299 Encounter for prophylactic measures, unspecified: Secondary | ICD-10-CM | POA: Diagnosis not present

## 2019-03-17 DIAGNOSIS — Z6837 Body mass index (BMI) 37.0-37.9, adult: Secondary | ICD-10-CM | POA: Diagnosis not present

## 2019-03-17 DIAGNOSIS — Z1331 Encounter for screening for depression: Secondary | ICD-10-CM | POA: Diagnosis not present

## 2019-03-17 DIAGNOSIS — Z1339 Encounter for screening examination for other mental health and behavioral disorders: Secondary | ICD-10-CM | POA: Diagnosis not present

## 2019-06-17 DIAGNOSIS — I25119 Atherosclerotic heart disease of native coronary artery with unspecified angina pectoris: Secondary | ICD-10-CM | POA: Diagnosis not present

## 2019-06-17 DIAGNOSIS — I10 Essential (primary) hypertension: Secondary | ICD-10-CM | POA: Diagnosis not present

## 2019-06-17 DIAGNOSIS — I4891 Unspecified atrial fibrillation: Secondary | ICD-10-CM | POA: Diagnosis not present

## 2019-06-17 DIAGNOSIS — Z6837 Body mass index (BMI) 37.0-37.9, adult: Secondary | ICD-10-CM | POA: Diagnosis not present

## 2019-06-17 DIAGNOSIS — Z299 Encounter for prophylactic measures, unspecified: Secondary | ICD-10-CM | POA: Diagnosis not present

## 2019-06-17 DIAGNOSIS — I272 Pulmonary hypertension, unspecified: Secondary | ICD-10-CM | POA: Diagnosis not present

## 2019-06-17 DIAGNOSIS — E1165 Type 2 diabetes mellitus with hyperglycemia: Secondary | ICD-10-CM | POA: Diagnosis not present

## 2019-06-20 DIAGNOSIS — Z23 Encounter for immunization: Secondary | ICD-10-CM | POA: Diagnosis not present

## 2019-07-16 DIAGNOSIS — G4730 Sleep apnea, unspecified: Secondary | ICD-10-CM | POA: Diagnosis not present

## 2019-07-16 DIAGNOSIS — I10 Essential (primary) hypertension: Secondary | ICD-10-CM | POA: Diagnosis not present

## 2019-07-16 DIAGNOSIS — I340 Nonrheumatic mitral (valve) insufficiency: Secondary | ICD-10-CM | POA: Diagnosis not present

## 2019-07-16 DIAGNOSIS — R079 Chest pain, unspecified: Secondary | ICD-10-CM | POA: Diagnosis not present

## 2019-07-16 DIAGNOSIS — E119 Type 2 diabetes mellitus without complications: Secondary | ICD-10-CM | POA: Diagnosis not present

## 2019-07-16 DIAGNOSIS — T50905S Adverse effect of unspecified drugs, medicaments and biological substances, sequela: Secondary | ICD-10-CM | POA: Diagnosis not present

## 2019-07-16 DIAGNOSIS — I4892 Unspecified atrial flutter: Secondary | ICD-10-CM | POA: Diagnosis not present

## 2019-07-18 DIAGNOSIS — Z23 Encounter for immunization: Secondary | ICD-10-CM | POA: Diagnosis not present

## 2019-07-28 DIAGNOSIS — E1165 Type 2 diabetes mellitus with hyperglycemia: Secondary | ICD-10-CM | POA: Diagnosis not present

## 2019-08-28 DIAGNOSIS — I4891 Unspecified atrial fibrillation: Secondary | ICD-10-CM | POA: Diagnosis not present

## 2019-08-28 DIAGNOSIS — I10 Essential (primary) hypertension: Secondary | ICD-10-CM | POA: Diagnosis not present

## 2019-09-22 DIAGNOSIS — E1165 Type 2 diabetes mellitus with hyperglycemia: Secondary | ICD-10-CM | POA: Diagnosis not present

## 2019-09-22 DIAGNOSIS — Z6836 Body mass index (BMI) 36.0-36.9, adult: Secondary | ICD-10-CM | POA: Diagnosis not present

## 2019-09-22 DIAGNOSIS — I4891 Unspecified atrial fibrillation: Secondary | ICD-10-CM | POA: Diagnosis not present

## 2019-09-22 DIAGNOSIS — Z299 Encounter for prophylactic measures, unspecified: Secondary | ICD-10-CM | POA: Diagnosis not present

## 2019-09-22 DIAGNOSIS — I25119 Atherosclerotic heart disease of native coronary artery with unspecified angina pectoris: Secondary | ICD-10-CM | POA: Diagnosis not present

## 2019-09-22 DIAGNOSIS — I10 Essential (primary) hypertension: Secondary | ICD-10-CM | POA: Diagnosis not present

## 2019-09-22 DIAGNOSIS — E7800 Pure hypercholesterolemia, unspecified: Secondary | ICD-10-CM | POA: Diagnosis not present

## 2019-09-29 DIAGNOSIS — E1159 Type 2 diabetes mellitus with other circulatory complications: Secondary | ICD-10-CM | POA: Diagnosis not present

## 2019-09-29 DIAGNOSIS — E1140 Type 2 diabetes mellitus with diabetic neuropathy, unspecified: Secondary | ICD-10-CM | POA: Diagnosis not present

## 2019-10-14 DIAGNOSIS — I4891 Unspecified atrial fibrillation: Secondary | ICD-10-CM | POA: Diagnosis not present

## 2019-10-14 DIAGNOSIS — I10 Essential (primary) hypertension: Secondary | ICD-10-CM | POA: Diagnosis not present

## 2019-10-14 DIAGNOSIS — E1165 Type 2 diabetes mellitus with hyperglycemia: Secondary | ICD-10-CM | POA: Diagnosis not present

## 2019-10-14 DIAGNOSIS — Z299 Encounter for prophylactic measures, unspecified: Secondary | ICD-10-CM | POA: Diagnosis not present

## 2019-10-14 DIAGNOSIS — I25119 Atherosclerotic heart disease of native coronary artery with unspecified angina pectoris: Secondary | ICD-10-CM | POA: Diagnosis not present

## 2019-10-28 DIAGNOSIS — E1165 Type 2 diabetes mellitus with hyperglycemia: Secondary | ICD-10-CM | POA: Diagnosis not present

## 2019-11-17 DIAGNOSIS — I10 Essential (primary) hypertension: Secondary | ICD-10-CM | POA: Diagnosis not present

## 2019-11-17 DIAGNOSIS — I639 Cerebral infarction, unspecified: Secondary | ICD-10-CM | POA: Diagnosis not present

## 2019-11-17 DIAGNOSIS — I4892 Unspecified atrial flutter: Secondary | ICD-10-CM | POA: Diagnosis not present

## 2019-11-17 DIAGNOSIS — R0609 Other forms of dyspnea: Secondary | ICD-10-CM | POA: Diagnosis not present

## 2019-11-17 DIAGNOSIS — I340 Nonrheumatic mitral (valve) insufficiency: Secondary | ICD-10-CM | POA: Diagnosis not present

## 2019-11-17 DIAGNOSIS — E1169 Type 2 diabetes mellitus with other specified complication: Secondary | ICD-10-CM | POA: Diagnosis not present

## 2019-11-17 DIAGNOSIS — Z7901 Long term (current) use of anticoagulants: Secondary | ICD-10-CM | POA: Diagnosis not present

## 2019-11-24 DIAGNOSIS — I10 Essential (primary) hypertension: Secondary | ICD-10-CM | POA: Diagnosis not present

## 2019-11-24 DIAGNOSIS — E1165 Type 2 diabetes mellitus with hyperglycemia: Secondary | ICD-10-CM | POA: Diagnosis not present

## 2019-11-24 DIAGNOSIS — Z6837 Body mass index (BMI) 37.0-37.9, adult: Secondary | ICD-10-CM | POA: Diagnosis not present

## 2019-11-24 DIAGNOSIS — Z299 Encounter for prophylactic measures, unspecified: Secondary | ICD-10-CM | POA: Diagnosis not present

## 2019-11-24 DIAGNOSIS — I4891 Unspecified atrial fibrillation: Secondary | ICD-10-CM | POA: Diagnosis not present

## 2019-11-24 DIAGNOSIS — I272 Pulmonary hypertension, unspecified: Secondary | ICD-10-CM | POA: Diagnosis not present

## 2019-11-24 DIAGNOSIS — I25119 Atherosclerotic heart disease of native coronary artery with unspecified angina pectoris: Secondary | ICD-10-CM | POA: Diagnosis not present

## 2019-11-27 DIAGNOSIS — I4891 Unspecified atrial fibrillation: Secondary | ICD-10-CM | POA: Diagnosis not present

## 2019-11-27 DIAGNOSIS — E119 Type 2 diabetes mellitus without complications: Secondary | ICD-10-CM | POA: Diagnosis not present

## 2019-11-27 DIAGNOSIS — E1165 Type 2 diabetes mellitus with hyperglycemia: Secondary | ICD-10-CM | POA: Diagnosis not present

## 2019-11-27 DIAGNOSIS — E785 Hyperlipidemia, unspecified: Secondary | ICD-10-CM | POA: Diagnosis not present

## 2019-11-27 DIAGNOSIS — I10 Essential (primary) hypertension: Secondary | ICD-10-CM | POA: Diagnosis not present

## 2019-12-11 DIAGNOSIS — I499 Cardiac arrhythmia, unspecified: Secondary | ICD-10-CM | POA: Diagnosis not present

## 2019-12-11 DIAGNOSIS — R0602 Shortness of breath: Secondary | ICD-10-CM | POA: Diagnosis not present

## 2019-12-11 DIAGNOSIS — R0609 Other forms of dyspnea: Secondary | ICD-10-CM | POA: Diagnosis not present

## 2019-12-18 DIAGNOSIS — Z299 Encounter for prophylactic measures, unspecified: Secondary | ICD-10-CM | POA: Diagnosis not present

## 2019-12-18 DIAGNOSIS — I272 Pulmonary hypertension, unspecified: Secondary | ICD-10-CM | POA: Diagnosis not present

## 2019-12-18 DIAGNOSIS — I4891 Unspecified atrial fibrillation: Secondary | ICD-10-CM | POA: Diagnosis not present

## 2019-12-18 DIAGNOSIS — I10 Essential (primary) hypertension: Secondary | ICD-10-CM | POA: Diagnosis not present

## 2019-12-18 DIAGNOSIS — E1165 Type 2 diabetes mellitus with hyperglycemia: Secondary | ICD-10-CM | POA: Diagnosis not present

## 2019-12-21 ENCOUNTER — Ambulatory Visit: Payer: Medicare Other | Admitting: Neurology

## 2019-12-21 ENCOUNTER — Ambulatory Visit: Payer: PRIVATE HEALTH INSURANCE | Admitting: Neurology

## 2019-12-21 DIAGNOSIS — H2511 Age-related nuclear cataract, right eye: Secondary | ICD-10-CM | POA: Diagnosis not present

## 2019-12-29 DIAGNOSIS — E119 Type 2 diabetes mellitus without complications: Secondary | ICD-10-CM | POA: Diagnosis not present

## 2019-12-29 DIAGNOSIS — I25119 Atherosclerotic heart disease of native coronary artery with unspecified angina pectoris: Secondary | ICD-10-CM | POA: Diagnosis not present

## 2019-12-29 DIAGNOSIS — E1165 Type 2 diabetes mellitus with hyperglycemia: Secondary | ICD-10-CM | POA: Diagnosis not present

## 2019-12-29 DIAGNOSIS — I10 Essential (primary) hypertension: Secondary | ICD-10-CM | POA: Diagnosis not present

## 2019-12-29 DIAGNOSIS — G4730 Sleep apnea, unspecified: Secondary | ICD-10-CM | POA: Diagnosis not present

## 2019-12-29 DIAGNOSIS — Z299 Encounter for prophylactic measures, unspecified: Secondary | ICD-10-CM | POA: Diagnosis not present

## 2019-12-29 DIAGNOSIS — I4891 Unspecified atrial fibrillation: Secondary | ICD-10-CM | POA: Diagnosis not present

## 2020-01-14 DIAGNOSIS — H2512 Age-related nuclear cataract, left eye: Secondary | ICD-10-CM | POA: Diagnosis not present

## 2020-01-21 DIAGNOSIS — Z7901 Long term (current) use of anticoagulants: Secondary | ICD-10-CM | POA: Diagnosis not present

## 2020-01-21 DIAGNOSIS — I4892 Unspecified atrial flutter: Secondary | ICD-10-CM | POA: Diagnosis not present

## 2020-01-21 DIAGNOSIS — I10 Essential (primary) hypertension: Secondary | ICD-10-CM | POA: Diagnosis not present

## 2020-01-21 DIAGNOSIS — I639 Cerebral infarction, unspecified: Secondary | ICD-10-CM | POA: Diagnosis not present

## 2020-01-21 DIAGNOSIS — E1169 Type 2 diabetes mellitus with other specified complication: Secondary | ICD-10-CM | POA: Diagnosis not present

## 2020-01-21 DIAGNOSIS — I340 Nonrheumatic mitral (valve) insufficiency: Secondary | ICD-10-CM | POA: Diagnosis not present

## 2020-01-21 DIAGNOSIS — R0609 Other forms of dyspnea: Secondary | ICD-10-CM | POA: Diagnosis not present

## 2020-01-26 DIAGNOSIS — I4891 Unspecified atrial fibrillation: Secondary | ICD-10-CM | POA: Diagnosis not present

## 2020-01-26 DIAGNOSIS — I10 Essential (primary) hypertension: Secondary | ICD-10-CM | POA: Diagnosis not present

## 2020-01-26 DIAGNOSIS — I25119 Atherosclerotic heart disease of native coronary artery with unspecified angina pectoris: Secondary | ICD-10-CM | POA: Diagnosis not present

## 2020-01-26 DIAGNOSIS — Z299 Encounter for prophylactic measures, unspecified: Secondary | ICD-10-CM | POA: Diagnosis not present

## 2020-01-26 DIAGNOSIS — E1165 Type 2 diabetes mellitus with hyperglycemia: Secondary | ICD-10-CM | POA: Diagnosis not present

## 2020-01-28 DIAGNOSIS — I4891 Unspecified atrial fibrillation: Secondary | ICD-10-CM | POA: Diagnosis not present

## 2020-01-28 DIAGNOSIS — E785 Hyperlipidemia, unspecified: Secondary | ICD-10-CM | POA: Diagnosis not present

## 2020-01-28 DIAGNOSIS — I10 Essential (primary) hypertension: Secondary | ICD-10-CM | POA: Diagnosis not present

## 2020-01-28 DIAGNOSIS — E1165 Type 2 diabetes mellitus with hyperglycemia: Secondary | ICD-10-CM | POA: Diagnosis not present

## 2020-01-28 DIAGNOSIS — E119 Type 2 diabetes mellitus without complications: Secondary | ICD-10-CM | POA: Diagnosis not present

## 2020-02-19 DIAGNOSIS — H04123 Dry eye syndrome of bilateral lacrimal glands: Secondary | ICD-10-CM | POA: Diagnosis not present

## 2020-02-27 DIAGNOSIS — E1165 Type 2 diabetes mellitus with hyperglycemia: Secondary | ICD-10-CM | POA: Diagnosis not present

## 2020-03-15 DIAGNOSIS — Z23 Encounter for immunization: Secondary | ICD-10-CM | POA: Diagnosis not present

## 2020-03-18 DIAGNOSIS — Z6836 Body mass index (BMI) 36.0-36.9, adult: Secondary | ICD-10-CM | POA: Diagnosis not present

## 2020-03-18 DIAGNOSIS — E7800 Pure hypercholesterolemia, unspecified: Secondary | ICD-10-CM | POA: Diagnosis not present

## 2020-03-18 DIAGNOSIS — I10 Essential (primary) hypertension: Secondary | ICD-10-CM | POA: Diagnosis not present

## 2020-03-18 DIAGNOSIS — Z0000 Encounter for general adult medical examination without abnormal findings: Secondary | ICD-10-CM | POA: Diagnosis not present

## 2020-03-18 DIAGNOSIS — Z7189 Other specified counseling: Secondary | ICD-10-CM | POA: Diagnosis not present

## 2020-03-18 DIAGNOSIS — Z1331 Encounter for screening for depression: Secondary | ICD-10-CM | POA: Diagnosis not present

## 2020-03-18 DIAGNOSIS — Z79899 Other long term (current) drug therapy: Secondary | ICD-10-CM | POA: Diagnosis not present

## 2020-03-18 DIAGNOSIS — Z299 Encounter for prophylactic measures, unspecified: Secondary | ICD-10-CM | POA: Diagnosis not present

## 2020-03-18 DIAGNOSIS — R5383 Other fatigue: Secondary | ICD-10-CM | POA: Diagnosis not present

## 2020-03-18 DIAGNOSIS — Z1339 Encounter for screening examination for other mental health and behavioral disorders: Secondary | ICD-10-CM | POA: Diagnosis not present

## 2020-03-18 DIAGNOSIS — Z87891 Personal history of nicotine dependence: Secondary | ICD-10-CM | POA: Diagnosis not present

## 2020-03-23 DIAGNOSIS — Z79899 Other long term (current) drug therapy: Secondary | ICD-10-CM | POA: Diagnosis not present

## 2020-03-23 DIAGNOSIS — E7800 Pure hypercholesterolemia, unspecified: Secondary | ICD-10-CM | POA: Diagnosis not present

## 2020-03-23 DIAGNOSIS — Z125 Encounter for screening for malignant neoplasm of prostate: Secondary | ICD-10-CM | POA: Diagnosis not present

## 2020-03-23 DIAGNOSIS — R5383 Other fatigue: Secondary | ICD-10-CM | POA: Diagnosis not present

## 2020-03-29 DIAGNOSIS — E1165 Type 2 diabetes mellitus with hyperglycemia: Secondary | ICD-10-CM | POA: Diagnosis not present

## 2020-03-29 DIAGNOSIS — I4891 Unspecified atrial fibrillation: Secondary | ICD-10-CM | POA: Diagnosis not present

## 2020-03-29 DIAGNOSIS — E785 Hyperlipidemia, unspecified: Secondary | ICD-10-CM | POA: Diagnosis not present

## 2020-03-29 DIAGNOSIS — I10 Essential (primary) hypertension: Secondary | ICD-10-CM | POA: Diagnosis not present

## 2020-03-29 DIAGNOSIS — E119 Type 2 diabetes mellitus without complications: Secondary | ICD-10-CM | POA: Diagnosis not present

## 2020-04-07 DIAGNOSIS — I4891 Unspecified atrial fibrillation: Secondary | ICD-10-CM | POA: Diagnosis not present

## 2020-04-07 DIAGNOSIS — D6869 Other thrombophilia: Secondary | ICD-10-CM | POA: Diagnosis not present

## 2020-04-07 DIAGNOSIS — I10 Essential (primary) hypertension: Secondary | ICD-10-CM | POA: Diagnosis not present

## 2020-04-07 DIAGNOSIS — Z299 Encounter for prophylactic measures, unspecified: Secondary | ICD-10-CM | POA: Diagnosis not present

## 2020-04-07 DIAGNOSIS — E1165 Type 2 diabetes mellitus with hyperglycemia: Secondary | ICD-10-CM | POA: Diagnosis not present

## 2020-04-28 DIAGNOSIS — E1165 Type 2 diabetes mellitus with hyperglycemia: Secondary | ICD-10-CM | POA: Diagnosis not present

## 2020-04-29 DIAGNOSIS — I4891 Unspecified atrial fibrillation: Secondary | ICD-10-CM | POA: Diagnosis not present

## 2020-04-29 DIAGNOSIS — E785 Hyperlipidemia, unspecified: Secondary | ICD-10-CM | POA: Diagnosis not present

## 2020-04-29 DIAGNOSIS — E119 Type 2 diabetes mellitus without complications: Secondary | ICD-10-CM | POA: Diagnosis not present

## 2020-04-29 DIAGNOSIS — I10 Essential (primary) hypertension: Secondary | ICD-10-CM | POA: Diagnosis not present

## 2020-05-30 DIAGNOSIS — I4891 Unspecified atrial fibrillation: Secondary | ICD-10-CM | POA: Diagnosis not present

## 2020-05-30 DIAGNOSIS — E785 Hyperlipidemia, unspecified: Secondary | ICD-10-CM | POA: Diagnosis not present

## 2020-05-30 DIAGNOSIS — E119 Type 2 diabetes mellitus without complications: Secondary | ICD-10-CM | POA: Diagnosis not present

## 2020-05-30 DIAGNOSIS — E1165 Type 2 diabetes mellitus with hyperglycemia: Secondary | ICD-10-CM | POA: Diagnosis not present

## 2020-05-30 DIAGNOSIS — I10 Essential (primary) hypertension: Secondary | ICD-10-CM | POA: Diagnosis not present

## 2020-06-27 DIAGNOSIS — E1165 Type 2 diabetes mellitus with hyperglycemia: Secondary | ICD-10-CM | POA: Diagnosis not present

## 2020-07-11 DIAGNOSIS — Z299 Encounter for prophylactic measures, unspecified: Secondary | ICD-10-CM | POA: Diagnosis not present

## 2020-07-11 DIAGNOSIS — I25119 Atherosclerotic heart disease of native coronary artery with unspecified angina pectoris: Secondary | ICD-10-CM | POA: Diagnosis not present

## 2020-07-11 DIAGNOSIS — I4891 Unspecified atrial fibrillation: Secondary | ICD-10-CM | POA: Diagnosis not present

## 2020-07-11 DIAGNOSIS — I10 Essential (primary) hypertension: Secondary | ICD-10-CM | POA: Diagnosis not present

## 2020-07-11 DIAGNOSIS — E1165 Type 2 diabetes mellitus with hyperglycemia: Secondary | ICD-10-CM | POA: Diagnosis not present

## 2020-07-17 DIAGNOSIS — I4891 Unspecified atrial fibrillation: Secondary | ICD-10-CM | POA: Diagnosis not present

## 2020-07-17 DIAGNOSIS — K5730 Diverticulosis of large intestine without perforation or abscess without bleeding: Secondary | ICD-10-CM | POA: Diagnosis not present

## 2020-07-17 DIAGNOSIS — E119 Type 2 diabetes mellitus without complications: Secondary | ICD-10-CM | POA: Diagnosis not present

## 2020-07-17 DIAGNOSIS — K760 Fatty (change of) liver, not elsewhere classified: Secondary | ICD-10-CM | POA: Diagnosis not present

## 2020-07-17 DIAGNOSIS — N3289 Other specified disorders of bladder: Secondary | ICD-10-CM | POA: Diagnosis not present

## 2020-07-17 DIAGNOSIS — I2699 Other pulmonary embolism without acute cor pulmonale: Secondary | ICD-10-CM | POA: Diagnosis not present

## 2020-07-17 DIAGNOSIS — R10814 Left lower quadrant abdominal tenderness: Secondary | ICD-10-CM | POA: Diagnosis not present

## 2020-07-17 DIAGNOSIS — K5900 Constipation, unspecified: Secondary | ICD-10-CM | POA: Diagnosis not present

## 2020-07-17 DIAGNOSIS — Z7901 Long term (current) use of anticoagulants: Secondary | ICD-10-CM | POA: Diagnosis not present

## 2020-07-18 DIAGNOSIS — N3289 Other specified disorders of bladder: Secondary | ICD-10-CM | POA: Diagnosis not present

## 2020-07-18 DIAGNOSIS — K5900 Constipation, unspecified: Secondary | ICD-10-CM | POA: Diagnosis not present

## 2020-07-18 DIAGNOSIS — K5730 Diverticulosis of large intestine without perforation or abscess without bleeding: Secondary | ICD-10-CM | POA: Diagnosis not present

## 2020-07-18 DIAGNOSIS — R10814 Left lower quadrant abdominal tenderness: Secondary | ICD-10-CM | POA: Diagnosis not present

## 2020-07-18 DIAGNOSIS — K760 Fatty (change of) liver, not elsewhere classified: Secondary | ICD-10-CM | POA: Diagnosis not present

## 2020-07-26 DIAGNOSIS — I4892 Unspecified atrial flutter: Secondary | ICD-10-CM | POA: Diagnosis not present

## 2020-07-26 DIAGNOSIS — I10 Essential (primary) hypertension: Secondary | ICD-10-CM | POA: Diagnosis not present

## 2020-07-26 DIAGNOSIS — R0609 Other forms of dyspnea: Secondary | ICD-10-CM | POA: Diagnosis not present

## 2020-07-26 DIAGNOSIS — G4733 Obstructive sleep apnea (adult) (pediatric): Secondary | ICD-10-CM | POA: Diagnosis not present

## 2020-07-26 DIAGNOSIS — E1169 Type 2 diabetes mellitus with other specified complication: Secondary | ICD-10-CM | POA: Diagnosis not present

## 2020-07-26 DIAGNOSIS — I340 Nonrheumatic mitral (valve) insufficiency: Secondary | ICD-10-CM | POA: Diagnosis not present

## 2020-07-26 DIAGNOSIS — I639 Cerebral infarction, unspecified: Secondary | ICD-10-CM | POA: Diagnosis not present

## 2020-07-28 DIAGNOSIS — E1165 Type 2 diabetes mellitus with hyperglycemia: Secondary | ICD-10-CM | POA: Diagnosis not present

## 2020-08-02 DIAGNOSIS — Z299 Encounter for prophylactic measures, unspecified: Secondary | ICD-10-CM | POA: Diagnosis not present

## 2020-08-02 DIAGNOSIS — I10 Essential (primary) hypertension: Secondary | ICD-10-CM | POA: Diagnosis not present

## 2020-08-02 DIAGNOSIS — E1140 Type 2 diabetes mellitus with diabetic neuropathy, unspecified: Secondary | ICD-10-CM | POA: Diagnosis not present

## 2020-08-02 DIAGNOSIS — Z6835 Body mass index (BMI) 35.0-35.9, adult: Secondary | ICD-10-CM | POA: Diagnosis not present

## 2020-08-02 DIAGNOSIS — I4891 Unspecified atrial fibrillation: Secondary | ICD-10-CM | POA: Diagnosis not present

## 2020-08-02 DIAGNOSIS — I2699 Other pulmonary embolism without acute cor pulmonale: Secondary | ICD-10-CM | POA: Diagnosis not present

## 2020-08-26 DIAGNOSIS — E1165 Type 2 diabetes mellitus with hyperglycemia: Secondary | ICD-10-CM | POA: Diagnosis not present

## 2020-08-27 DIAGNOSIS — I10 Essential (primary) hypertension: Secondary | ICD-10-CM | POA: Diagnosis not present

## 2020-08-27 DIAGNOSIS — E785 Hyperlipidemia, unspecified: Secondary | ICD-10-CM | POA: Diagnosis not present

## 2020-08-27 DIAGNOSIS — I4891 Unspecified atrial fibrillation: Secondary | ICD-10-CM | POA: Diagnosis not present

## 2020-08-27 DIAGNOSIS — E119 Type 2 diabetes mellitus without complications: Secondary | ICD-10-CM | POA: Diagnosis not present

## 2020-09-15 ENCOUNTER — Ambulatory Visit (INDEPENDENT_AMBULATORY_CARE_PROVIDER_SITE_OTHER): Payer: Self-pay

## 2020-09-15 ENCOUNTER — Other Ambulatory Visit: Payer: Self-pay

## 2020-09-15 ENCOUNTER — Other Ambulatory Visit: Payer: Self-pay | Admitting: Physician Assistant

## 2020-09-15 DIAGNOSIS — Z0000 Encounter for general adult medical examination without abnormal findings: Secondary | ICD-10-CM

## 2020-09-15 DIAGNOSIS — Z779 Other contact with and (suspected) exposures hazardous to health: Secondary | ICD-10-CM

## 2020-09-19 DIAGNOSIS — Z23 Encounter for immunization: Secondary | ICD-10-CM | POA: Diagnosis not present

## 2020-10-18 DIAGNOSIS — E1165 Type 2 diabetes mellitus with hyperglycemia: Secondary | ICD-10-CM | POA: Diagnosis not present

## 2020-10-18 DIAGNOSIS — I4891 Unspecified atrial fibrillation: Secondary | ICD-10-CM | POA: Diagnosis not present

## 2020-10-18 DIAGNOSIS — E119 Type 2 diabetes mellitus without complications: Secondary | ICD-10-CM | POA: Diagnosis not present

## 2020-10-18 DIAGNOSIS — Z299 Encounter for prophylactic measures, unspecified: Secondary | ICD-10-CM | POA: Diagnosis not present

## 2020-10-18 DIAGNOSIS — I10 Essential (primary) hypertension: Secondary | ICD-10-CM | POA: Diagnosis not present

## 2020-10-27 DIAGNOSIS — I10 Essential (primary) hypertension: Secondary | ICD-10-CM | POA: Diagnosis not present

## 2020-10-27 DIAGNOSIS — E785 Hyperlipidemia, unspecified: Secondary | ICD-10-CM | POA: Diagnosis not present

## 2020-10-27 DIAGNOSIS — E119 Type 2 diabetes mellitus without complications: Secondary | ICD-10-CM | POA: Diagnosis not present

## 2020-10-27 DIAGNOSIS — I4891 Unspecified atrial fibrillation: Secondary | ICD-10-CM | POA: Diagnosis not present

## 2020-10-27 DIAGNOSIS — E1165 Type 2 diabetes mellitus with hyperglycemia: Secondary | ICD-10-CM | POA: Diagnosis not present

## 2020-11-27 DIAGNOSIS — E785 Hyperlipidemia, unspecified: Secondary | ICD-10-CM | POA: Diagnosis not present

## 2020-11-27 DIAGNOSIS — I4891 Unspecified atrial fibrillation: Secondary | ICD-10-CM | POA: Diagnosis not present

## 2020-11-27 DIAGNOSIS — I10 Essential (primary) hypertension: Secondary | ICD-10-CM | POA: Diagnosis not present

## 2020-11-27 DIAGNOSIS — E119 Type 2 diabetes mellitus without complications: Secondary | ICD-10-CM | POA: Diagnosis not present

## 2020-12-28 DIAGNOSIS — I4891 Unspecified atrial fibrillation: Secondary | ICD-10-CM | POA: Diagnosis not present

## 2020-12-28 DIAGNOSIS — E785 Hyperlipidemia, unspecified: Secondary | ICD-10-CM | POA: Diagnosis not present

## 2020-12-28 DIAGNOSIS — I10 Essential (primary) hypertension: Secondary | ICD-10-CM | POA: Diagnosis not present

## 2020-12-28 DIAGNOSIS — E1165 Type 2 diabetes mellitus with hyperglycemia: Secondary | ICD-10-CM | POA: Diagnosis not present

## 2020-12-28 DIAGNOSIS — E119 Type 2 diabetes mellitus without complications: Secondary | ICD-10-CM | POA: Diagnosis not present

## 2021-01-27 DIAGNOSIS — F4310 Post-traumatic stress disorder, unspecified: Secondary | ICD-10-CM | POA: Diagnosis not present

## 2021-01-27 DIAGNOSIS — I10 Essential (primary) hypertension: Secondary | ICD-10-CM | POA: Diagnosis not present

## 2021-01-27 DIAGNOSIS — E1165 Type 2 diabetes mellitus with hyperglycemia: Secondary | ICD-10-CM | POA: Diagnosis not present

## 2021-02-08 DIAGNOSIS — L711 Rhinophyma: Secondary | ICD-10-CM | POA: Diagnosis not present

## 2021-02-08 DIAGNOSIS — L299 Pruritus, unspecified: Secondary | ICD-10-CM | POA: Diagnosis not present

## 2021-02-08 DIAGNOSIS — L219 Seborrheic dermatitis, unspecified: Secondary | ICD-10-CM | POA: Diagnosis not present

## 2021-02-08 DIAGNOSIS — L249 Irritant contact dermatitis, unspecified cause: Secondary | ICD-10-CM | POA: Diagnosis not present

## 2021-02-08 DIAGNOSIS — L853 Xerosis cutis: Secondary | ICD-10-CM | POA: Diagnosis not present

## 2021-02-27 DIAGNOSIS — E1165 Type 2 diabetes mellitus with hyperglycemia: Secondary | ICD-10-CM | POA: Diagnosis not present

## 2021-02-27 DIAGNOSIS — F4310 Post-traumatic stress disorder, unspecified: Secondary | ICD-10-CM | POA: Diagnosis not present

## 2021-02-27 DIAGNOSIS — I10 Essential (primary) hypertension: Secondary | ICD-10-CM | POA: Diagnosis not present

## 2021-02-28 DIAGNOSIS — R040 Epistaxis: Secondary | ICD-10-CM | POA: Diagnosis not present

## 2021-02-28 DIAGNOSIS — I10 Essential (primary) hypertension: Secondary | ICD-10-CM | POA: Diagnosis not present

## 2021-02-28 DIAGNOSIS — E1165 Type 2 diabetes mellitus with hyperglycemia: Secondary | ICD-10-CM | POA: Diagnosis not present

## 2021-02-28 DIAGNOSIS — Z299 Encounter for prophylactic measures, unspecified: Secondary | ICD-10-CM | POA: Diagnosis not present

## 2021-03-29 DIAGNOSIS — E7800 Pure hypercholesterolemia, unspecified: Secondary | ICD-10-CM | POA: Diagnosis not present

## 2021-03-29 DIAGNOSIS — I10 Essential (primary) hypertension: Secondary | ICD-10-CM | POA: Diagnosis not present

## 2021-03-29 DIAGNOSIS — E1165 Type 2 diabetes mellitus with hyperglycemia: Secondary | ICD-10-CM | POA: Diagnosis not present

## 2021-04-05 DIAGNOSIS — E7800 Pure hypercholesterolemia, unspecified: Secondary | ICD-10-CM | POA: Diagnosis not present

## 2021-04-05 DIAGNOSIS — Z79899 Other long term (current) drug therapy: Secondary | ICD-10-CM | POA: Diagnosis not present

## 2021-04-05 DIAGNOSIS — Z0000 Encounter for general adult medical examination without abnormal findings: Secondary | ICD-10-CM | POA: Diagnosis not present

## 2021-04-05 DIAGNOSIS — Z299 Encounter for prophylactic measures, unspecified: Secondary | ICD-10-CM | POA: Diagnosis not present

## 2021-04-05 DIAGNOSIS — Z1339 Encounter for screening examination for other mental health and behavioral disorders: Secondary | ICD-10-CM | POA: Diagnosis not present

## 2021-04-05 DIAGNOSIS — Z6838 Body mass index (BMI) 38.0-38.9, adult: Secondary | ICD-10-CM | POA: Diagnosis not present

## 2021-04-05 DIAGNOSIS — Z7189 Other specified counseling: Secondary | ICD-10-CM | POA: Diagnosis not present

## 2021-04-05 DIAGNOSIS — Z1331 Encounter for screening for depression: Secondary | ICD-10-CM | POA: Diagnosis not present

## 2021-04-05 DIAGNOSIS — I10 Essential (primary) hypertension: Secondary | ICD-10-CM | POA: Diagnosis not present

## 2021-04-05 DIAGNOSIS — R5383 Other fatigue: Secondary | ICD-10-CM | POA: Diagnosis not present

## 2021-05-05 DIAGNOSIS — I25119 Atherosclerotic heart disease of native coronary artery with unspecified angina pectoris: Secondary | ICD-10-CM | POA: Diagnosis not present

## 2021-05-05 DIAGNOSIS — U071 COVID-19: Secondary | ICD-10-CM | POA: Diagnosis not present

## 2021-05-05 DIAGNOSIS — R6889 Other general symptoms and signs: Secondary | ICD-10-CM | POA: Diagnosis not present

## 2021-05-05 DIAGNOSIS — Z299 Encounter for prophylactic measures, unspecified: Secondary | ICD-10-CM | POA: Diagnosis not present

## 2021-05-12 DIAGNOSIS — U071 COVID-19: Secondary | ICD-10-CM | POA: Diagnosis not present

## 2021-05-12 DIAGNOSIS — I25119 Atherosclerotic heart disease of native coronary artery with unspecified angina pectoris: Secondary | ICD-10-CM | POA: Diagnosis not present

## 2021-05-12 DIAGNOSIS — J040 Acute laryngitis: Secondary | ICD-10-CM | POA: Diagnosis not present

## 2021-05-12 DIAGNOSIS — I4891 Unspecified atrial fibrillation: Secondary | ICD-10-CM | POA: Diagnosis not present

## 2021-05-12 DIAGNOSIS — Z299 Encounter for prophylactic measures, unspecified: Secondary | ICD-10-CM | POA: Diagnosis not present

## 2021-06-27 DIAGNOSIS — E1165 Type 2 diabetes mellitus with hyperglycemia: Secondary | ICD-10-CM | POA: Diagnosis not present

## 2021-07-27 DIAGNOSIS — E1165 Type 2 diabetes mellitus with hyperglycemia: Secondary | ICD-10-CM | POA: Diagnosis not present

## 2021-08-16 DIAGNOSIS — E119 Type 2 diabetes mellitus without complications: Secondary | ICD-10-CM | POA: Diagnosis not present

## 2021-08-16 DIAGNOSIS — I3489 Other nonrheumatic mitral valve disorders: Secondary | ICD-10-CM | POA: Diagnosis not present

## 2021-08-16 DIAGNOSIS — G4730 Sleep apnea, unspecified: Secondary | ICD-10-CM | POA: Diagnosis not present

## 2021-08-16 DIAGNOSIS — R0609 Other forms of dyspnea: Secondary | ICD-10-CM | POA: Diagnosis not present

## 2021-08-16 DIAGNOSIS — I10 Essential (primary) hypertension: Secondary | ICD-10-CM | POA: Diagnosis not present

## 2021-08-16 DIAGNOSIS — I4892 Unspecified atrial flutter: Secondary | ICD-10-CM | POA: Diagnosis not present

## 2021-08-27 DIAGNOSIS — E1165 Type 2 diabetes mellitus with hyperglycemia: Secondary | ICD-10-CM | POA: Diagnosis not present

## 2021-08-29 DIAGNOSIS — D6869 Other thrombophilia: Secondary | ICD-10-CM | POA: Diagnosis not present

## 2021-08-29 DIAGNOSIS — I10 Essential (primary) hypertension: Secondary | ICD-10-CM | POA: Diagnosis not present

## 2021-08-29 DIAGNOSIS — E1165 Type 2 diabetes mellitus with hyperglycemia: Secondary | ICD-10-CM | POA: Diagnosis not present

## 2021-08-29 DIAGNOSIS — Z299 Encounter for prophylactic measures, unspecified: Secondary | ICD-10-CM | POA: Diagnosis not present

## 2021-08-29 DIAGNOSIS — J069 Acute upper respiratory infection, unspecified: Secondary | ICD-10-CM | POA: Diagnosis not present

## 2021-09-26 DIAGNOSIS — E1165 Type 2 diabetes mellitus with hyperglycemia: Secondary | ICD-10-CM | POA: Diagnosis not present

## 2021-10-26 DIAGNOSIS — E1165 Type 2 diabetes mellitus with hyperglycemia: Secondary | ICD-10-CM | POA: Diagnosis not present

## 2021-11-27 DIAGNOSIS — E1165 Type 2 diabetes mellitus with hyperglycemia: Secondary | ICD-10-CM | POA: Diagnosis not present

## 2021-12-06 DIAGNOSIS — Z6833 Body mass index (BMI) 33.0-33.9, adult: Secondary | ICD-10-CM | POA: Diagnosis not present

## 2021-12-06 DIAGNOSIS — I10 Essential (primary) hypertension: Secondary | ICD-10-CM | POA: Diagnosis not present

## 2021-12-06 DIAGNOSIS — Z299 Encounter for prophylactic measures, unspecified: Secondary | ICD-10-CM | POA: Diagnosis not present

## 2021-12-06 DIAGNOSIS — E440 Moderate protein-calorie malnutrition: Secondary | ICD-10-CM | POA: Diagnosis not present

## 2021-12-06 DIAGNOSIS — E1165 Type 2 diabetes mellitus with hyperglycemia: Secondary | ICD-10-CM | POA: Diagnosis not present

## 2021-12-27 DIAGNOSIS — E1165 Type 2 diabetes mellitus with hyperglycemia: Secondary | ICD-10-CM | POA: Diagnosis not present

## 2022-01-26 DIAGNOSIS — E1165 Type 2 diabetes mellitus with hyperglycemia: Secondary | ICD-10-CM | POA: Diagnosis not present

## 2022-02-26 DIAGNOSIS — E1165 Type 2 diabetes mellitus with hyperglycemia: Secondary | ICD-10-CM | POA: Diagnosis not present

## 2022-03-13 DIAGNOSIS — Z299 Encounter for prophylactic measures, unspecified: Secondary | ICD-10-CM | POA: Diagnosis not present

## 2022-03-13 DIAGNOSIS — I10 Essential (primary) hypertension: Secondary | ICD-10-CM | POA: Diagnosis not present

## 2022-03-13 DIAGNOSIS — E1165 Type 2 diabetes mellitus with hyperglycemia: Secondary | ICD-10-CM | POA: Diagnosis not present

## 2022-03-15 DIAGNOSIS — M65321 Trigger finger, right index finger: Secondary | ICD-10-CM | POA: Diagnosis not present

## 2022-03-28 DIAGNOSIS — E1165 Type 2 diabetes mellitus with hyperglycemia: Secondary | ICD-10-CM | POA: Diagnosis not present

## 2022-04-09 DIAGNOSIS — I10 Essential (primary) hypertension: Secondary | ICD-10-CM | POA: Diagnosis not present

## 2022-04-09 DIAGNOSIS — Z1331 Encounter for screening for depression: Secondary | ICD-10-CM | POA: Diagnosis not present

## 2022-04-09 DIAGNOSIS — Z79899 Other long term (current) drug therapy: Secondary | ICD-10-CM | POA: Diagnosis not present

## 2022-04-09 DIAGNOSIS — Z299 Encounter for prophylactic measures, unspecified: Secondary | ICD-10-CM | POA: Diagnosis not present

## 2022-04-09 DIAGNOSIS — Z7189 Other specified counseling: Secondary | ICD-10-CM | POA: Diagnosis not present

## 2022-04-09 DIAGNOSIS — Z0000 Encounter for general adult medical examination without abnormal findings: Secondary | ICD-10-CM | POA: Diagnosis not present

## 2022-04-09 DIAGNOSIS — Z87891 Personal history of nicotine dependence: Secondary | ICD-10-CM | POA: Diagnosis not present

## 2022-04-09 DIAGNOSIS — Z1339 Encounter for screening examination for other mental health and behavioral disorders: Secondary | ICD-10-CM | POA: Diagnosis not present

## 2022-04-09 DIAGNOSIS — I25119 Atherosclerotic heart disease of native coronary artery with unspecified angina pectoris: Secondary | ICD-10-CM | POA: Diagnosis not present

## 2022-04-09 DIAGNOSIS — E7800 Pure hypercholesterolemia, unspecified: Secondary | ICD-10-CM | POA: Diagnosis not present

## 2022-04-09 DIAGNOSIS — Z6834 Body mass index (BMI) 34.0-34.9, adult: Secondary | ICD-10-CM | POA: Diagnosis not present

## 2022-04-27 DIAGNOSIS — R5383 Other fatigue: Secondary | ICD-10-CM | POA: Diagnosis not present

## 2022-04-27 DIAGNOSIS — R059 Cough, unspecified: Secondary | ICD-10-CM | POA: Diagnosis not present

## 2022-04-27 DIAGNOSIS — E1165 Type 2 diabetes mellitus with hyperglycemia: Secondary | ICD-10-CM | POA: Diagnosis not present

## 2022-04-27 DIAGNOSIS — J069 Acute upper respiratory infection, unspecified: Secondary | ICD-10-CM | POA: Diagnosis not present

## 2022-04-27 DIAGNOSIS — Z299 Encounter for prophylactic measures, unspecified: Secondary | ICD-10-CM | POA: Diagnosis not present

## 2022-05-28 DIAGNOSIS — E1165 Type 2 diabetes mellitus with hyperglycemia: Secondary | ICD-10-CM | POA: Diagnosis not present

## 2022-06-27 DIAGNOSIS — E1165 Type 2 diabetes mellitus with hyperglycemia: Secondary | ICD-10-CM | POA: Diagnosis not present

## 2022-06-27 DIAGNOSIS — Z299 Encounter for prophylactic measures, unspecified: Secondary | ICD-10-CM | POA: Diagnosis not present

## 2022-06-27 DIAGNOSIS — I272 Pulmonary hypertension, unspecified: Secondary | ICD-10-CM | POA: Diagnosis not present

## 2022-06-27 DIAGNOSIS — I10 Essential (primary) hypertension: Secondary | ICD-10-CM | POA: Diagnosis not present

## 2022-06-27 DIAGNOSIS — D6869 Other thrombophilia: Secondary | ICD-10-CM | POA: Diagnosis not present

## 2022-07-28 DIAGNOSIS — E1165 Type 2 diabetes mellitus with hyperglycemia: Secondary | ICD-10-CM | POA: Diagnosis not present

## 2022-08-28 DIAGNOSIS — E1165 Type 2 diabetes mellitus with hyperglycemia: Secondary | ICD-10-CM | POA: Diagnosis not present

## 2022-09-02 IMAGING — DX DG CHEST 2V
2 series · 2 of 2 positions shown · non-contrast
Comparison: January 28, 2018

CLINICAL DATA: Annual physical examination. History of first
responder at [facility]

EXAM:
CHEST - 2 VIEW

[chest pa]
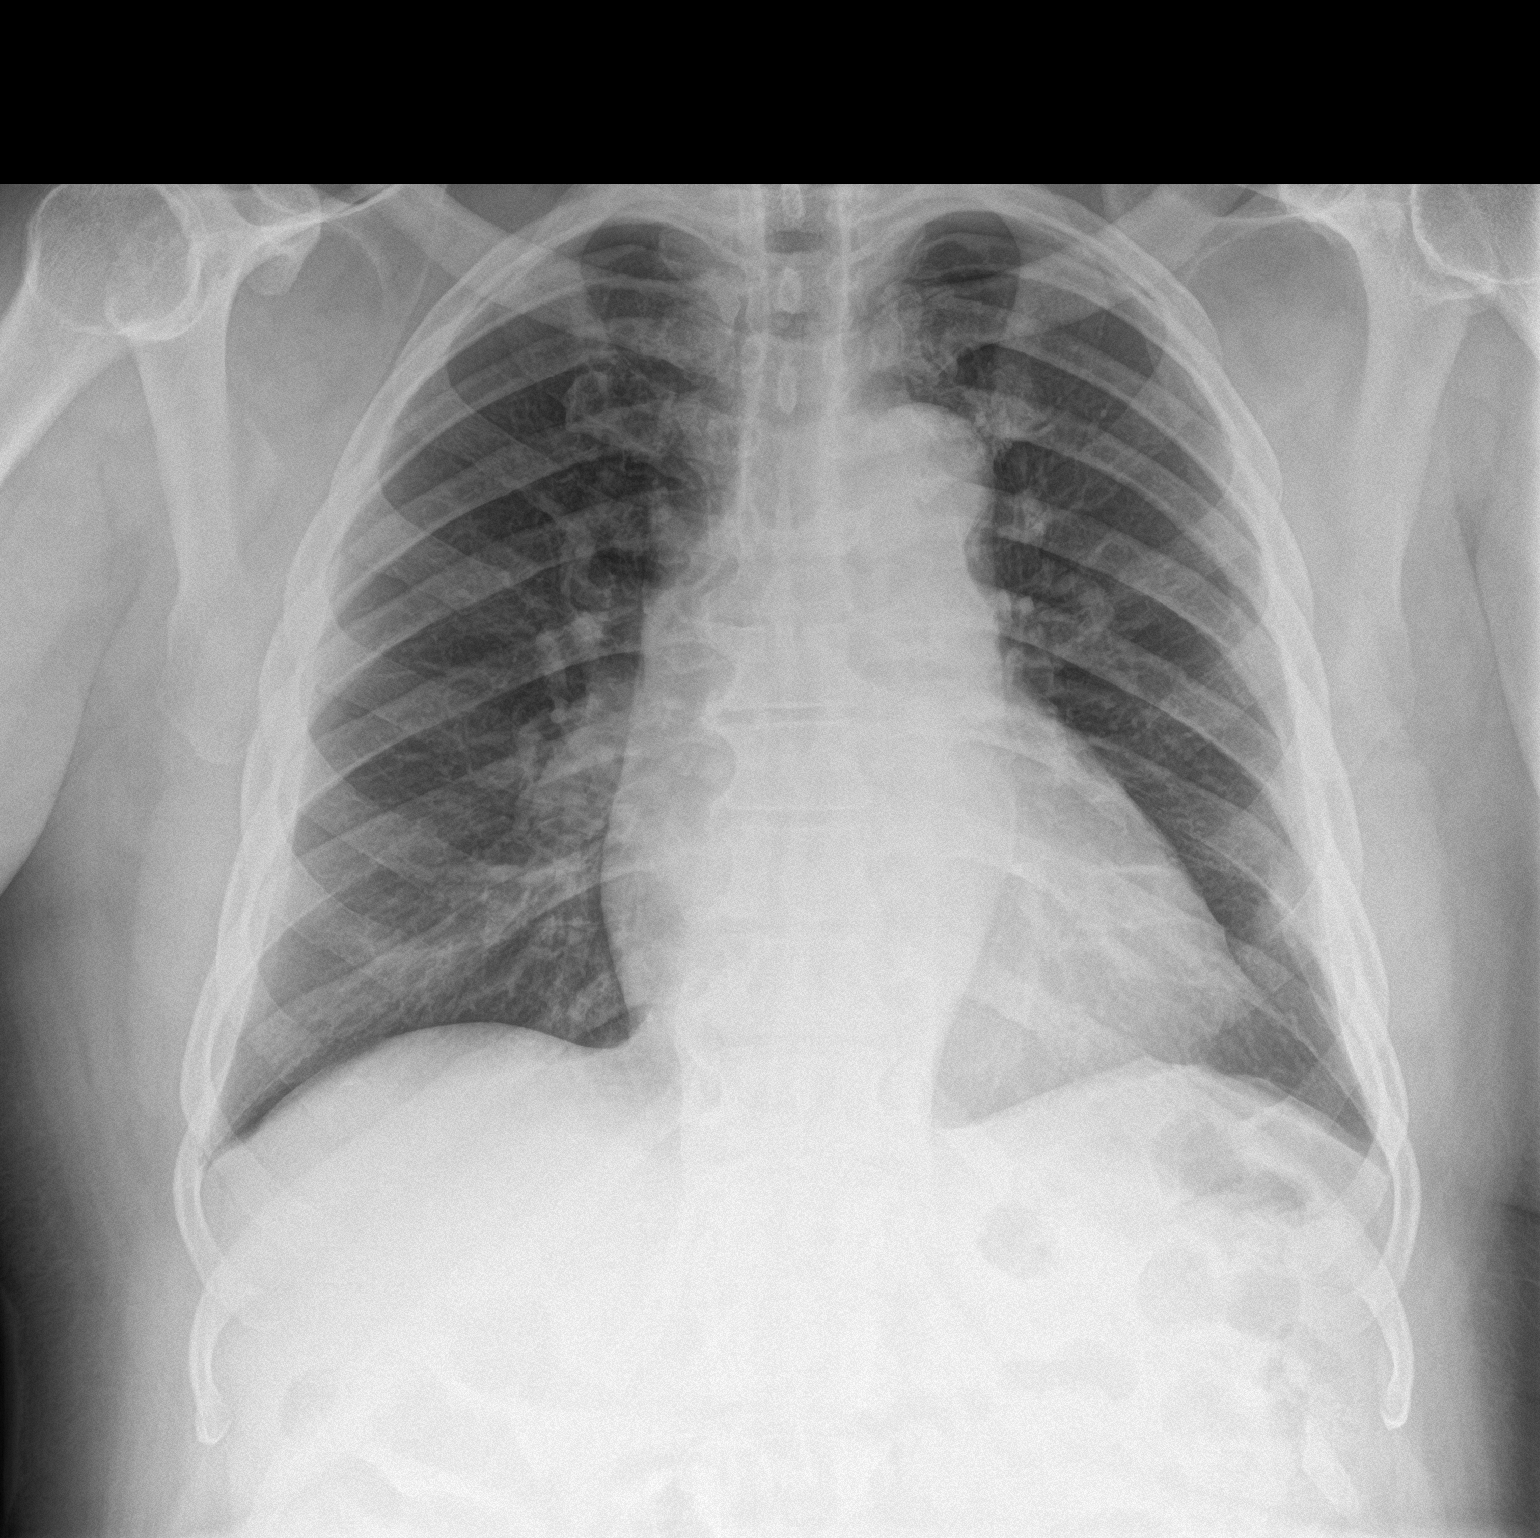

[chest lat]
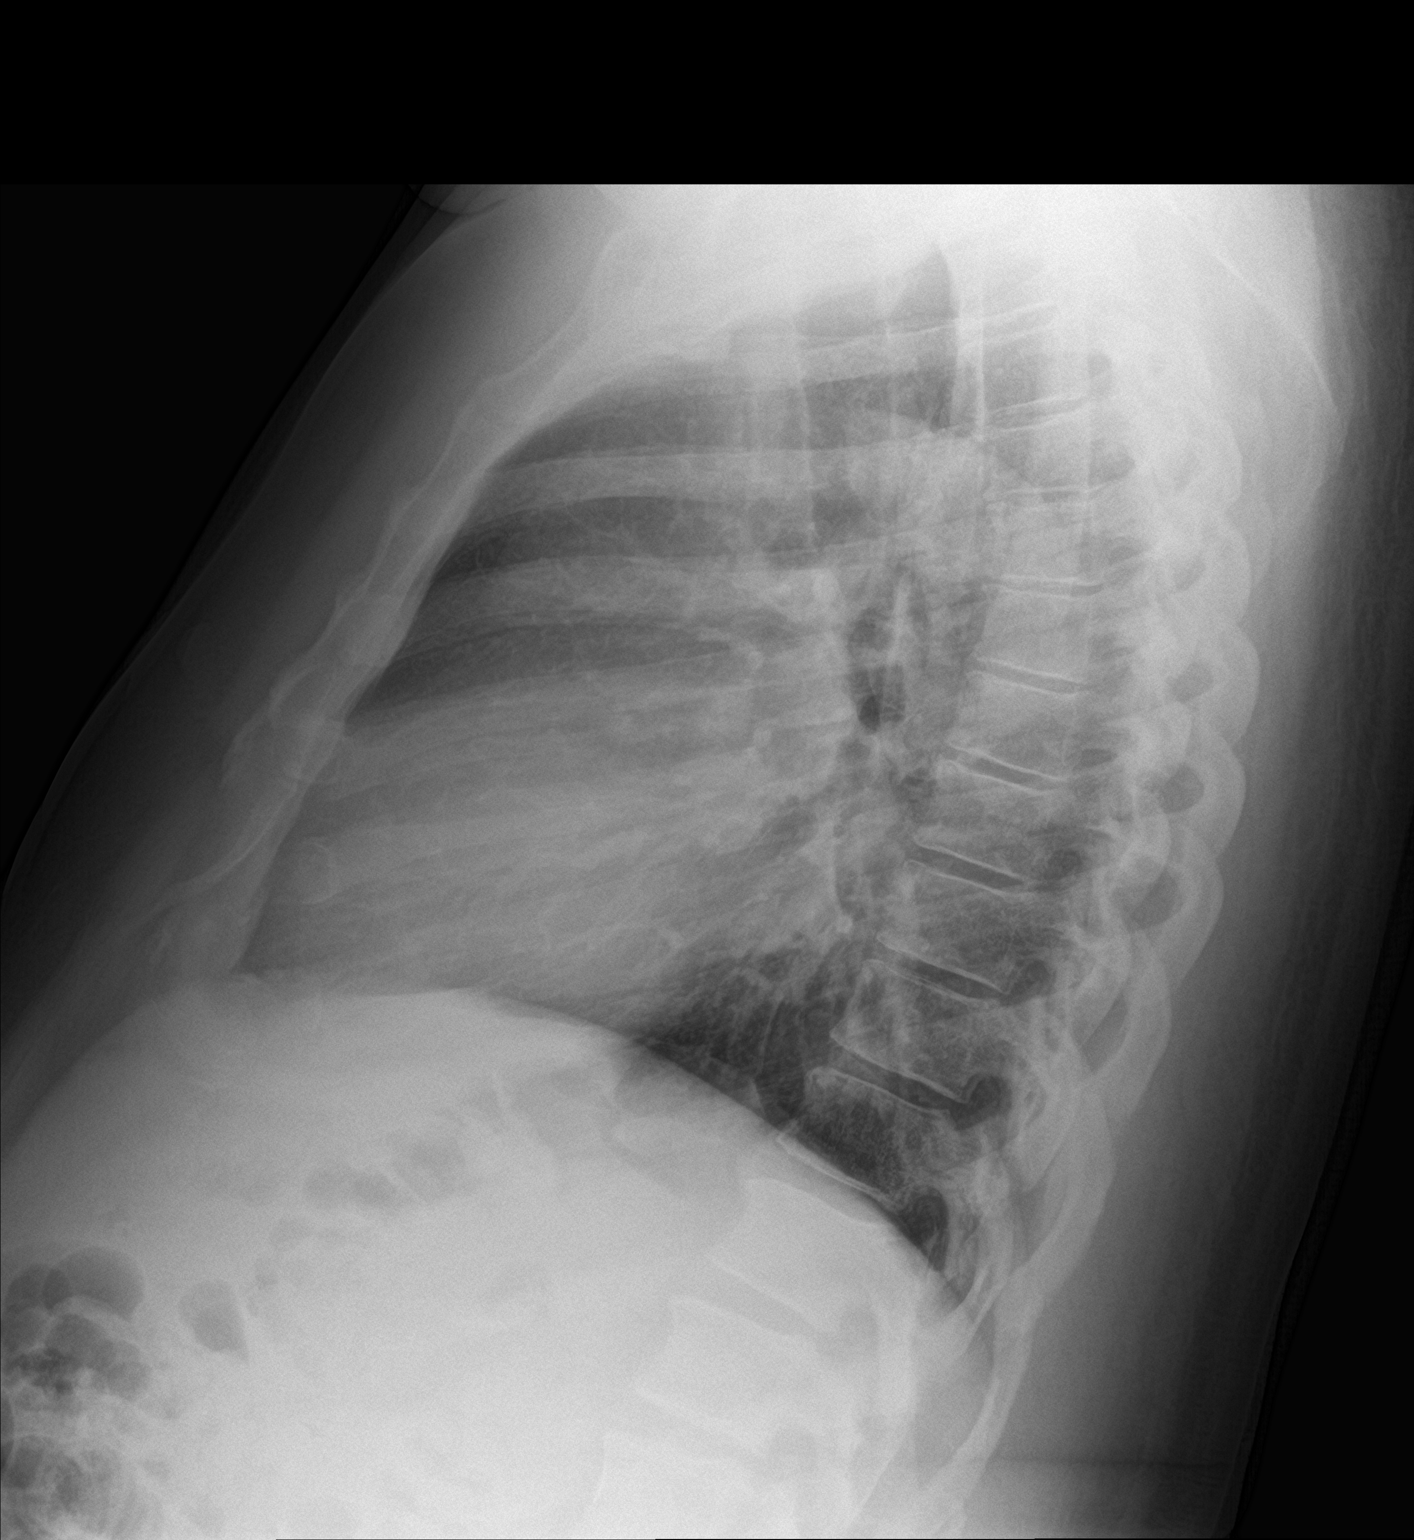

[2 of 2 positions shown; findings below may reference images not displayed]

FINDINGS: Lungs are clear. Heart is upper normal in size with pulmonary
vascularity normal. No adenopathy. There is mild degenerative change
in the thoracic spine.
IMPRESSION: Lungs clear.  Heart upper normal in size.  No adenopathy.

## 2022-09-28 DIAGNOSIS — E1165 Type 2 diabetes mellitus with hyperglycemia: Secondary | ICD-10-CM | POA: Diagnosis not present

## 2022-10-09 DIAGNOSIS — I10 Essential (primary) hypertension: Secondary | ICD-10-CM | POA: Diagnosis not present

## 2022-10-09 DIAGNOSIS — E1169 Type 2 diabetes mellitus with other specified complication: Secondary | ICD-10-CM | POA: Diagnosis not present

## 2022-10-09 DIAGNOSIS — E7800 Pure hypercholesterolemia, unspecified: Secondary | ICD-10-CM | POA: Diagnosis not present

## 2022-10-09 DIAGNOSIS — N529 Male erectile dysfunction, unspecified: Secondary | ICD-10-CM | POA: Diagnosis not present

## 2022-10-09 DIAGNOSIS — Z299 Encounter for prophylactic measures, unspecified: Secondary | ICD-10-CM | POA: Diagnosis not present

## 2022-10-16 DIAGNOSIS — L818 Other specified disorders of pigmentation: Secondary | ICD-10-CM | POA: Diagnosis not present

## 2022-10-16 DIAGNOSIS — L259 Unspecified contact dermatitis, unspecified cause: Secondary | ICD-10-CM | POA: Diagnosis not present

## 2022-10-28 DIAGNOSIS — E1165 Type 2 diabetes mellitus with hyperglycemia: Secondary | ICD-10-CM | POA: Diagnosis not present

## 2022-11-28 DIAGNOSIS — E1165 Type 2 diabetes mellitus with hyperglycemia: Secondary | ICD-10-CM | POA: Diagnosis not present

## 2022-12-10 DIAGNOSIS — M65321 Trigger finger, right index finger: Secondary | ICD-10-CM | POA: Diagnosis not present

## 2022-12-29 DIAGNOSIS — E1165 Type 2 diabetes mellitus with hyperglycemia: Secondary | ICD-10-CM | POA: Diagnosis not present

## 2023-01-22 DIAGNOSIS — R600 Localized edema: Secondary | ICD-10-CM | POA: Diagnosis not present

## 2023-01-22 DIAGNOSIS — Z2821 Immunization not carried out because of patient refusal: Secondary | ICD-10-CM | POA: Diagnosis not present

## 2023-01-22 DIAGNOSIS — Z299 Encounter for prophylactic measures, unspecified: Secondary | ICD-10-CM | POA: Diagnosis not present

## 2023-01-22 DIAGNOSIS — E1165 Type 2 diabetes mellitus with hyperglycemia: Secondary | ICD-10-CM | POA: Diagnosis not present

## 2023-01-22 DIAGNOSIS — I10 Essential (primary) hypertension: Secondary | ICD-10-CM | POA: Diagnosis not present

## 2023-01-28 DIAGNOSIS — E1165 Type 2 diabetes mellitus with hyperglycemia: Secondary | ICD-10-CM | POA: Diagnosis not present

## 2023-02-11 DIAGNOSIS — R600 Localized edema: Secondary | ICD-10-CM | POA: Diagnosis not present

## 2023-02-20 DIAGNOSIS — I10 Essential (primary) hypertension: Secondary | ICD-10-CM | POA: Diagnosis not present

## 2023-02-20 DIAGNOSIS — I4891 Unspecified atrial fibrillation: Secondary | ICD-10-CM | POA: Diagnosis not present

## 2023-02-20 DIAGNOSIS — Z299 Encounter for prophylactic measures, unspecified: Secondary | ICD-10-CM | POA: Diagnosis not present

## 2023-02-27 DIAGNOSIS — E1165 Type 2 diabetes mellitus with hyperglycemia: Secondary | ICD-10-CM | POA: Diagnosis not present

## 2023-03-29 DIAGNOSIS — E1165 Type 2 diabetes mellitus with hyperglycemia: Secondary | ICD-10-CM | POA: Diagnosis not present

## 2023-04-29 DIAGNOSIS — E1165 Type 2 diabetes mellitus with hyperglycemia: Secondary | ICD-10-CM | POA: Diagnosis not present

## 2023-05-24 DIAGNOSIS — Z79899 Other long term (current) drug therapy: Secondary | ICD-10-CM | POA: Diagnosis not present

## 2023-05-24 DIAGNOSIS — I10 Essential (primary) hypertension: Secondary | ICD-10-CM | POA: Diagnosis not present

## 2023-05-24 DIAGNOSIS — Z1339 Encounter for screening examination for other mental health and behavioral disorders: Secondary | ICD-10-CM | POA: Diagnosis not present

## 2023-05-24 DIAGNOSIS — Z125 Encounter for screening for malignant neoplasm of prostate: Secondary | ICD-10-CM | POA: Diagnosis not present

## 2023-05-24 DIAGNOSIS — Z7189 Other specified counseling: Secondary | ICD-10-CM | POA: Diagnosis not present

## 2023-05-24 DIAGNOSIS — Z6836 Body mass index (BMI) 36.0-36.9, adult: Secondary | ICD-10-CM | POA: Diagnosis not present

## 2023-05-24 DIAGNOSIS — R5383 Other fatigue: Secondary | ICD-10-CM | POA: Diagnosis not present

## 2023-05-24 DIAGNOSIS — Z0000 Encounter for general adult medical examination without abnormal findings: Secondary | ICD-10-CM | POA: Diagnosis not present

## 2023-05-24 DIAGNOSIS — E7800 Pure hypercholesterolemia, unspecified: Secondary | ICD-10-CM | POA: Diagnosis not present

## 2023-05-24 DIAGNOSIS — E1165 Type 2 diabetes mellitus with hyperglycemia: Secondary | ICD-10-CM | POA: Diagnosis not present

## 2023-05-24 DIAGNOSIS — Z299 Encounter for prophylactic measures, unspecified: Secondary | ICD-10-CM | POA: Diagnosis not present

## 2023-05-24 DIAGNOSIS — I4891 Unspecified atrial fibrillation: Secondary | ICD-10-CM | POA: Diagnosis not present

## 2023-05-24 DIAGNOSIS — Z1331 Encounter for screening for depression: Secondary | ICD-10-CM | POA: Diagnosis not present

## 2023-05-29 DIAGNOSIS — E1165 Type 2 diabetes mellitus with hyperglycemia: Secondary | ICD-10-CM | POA: Diagnosis not present

## 2023-06-28 DIAGNOSIS — E1165 Type 2 diabetes mellitus with hyperglycemia: Secondary | ICD-10-CM | POA: Diagnosis not present

## 2023-07-01 DIAGNOSIS — Z20822 Contact with and (suspected) exposure to covid-19: Secondary | ICD-10-CM | POA: Diagnosis not present

## 2023-07-01 DIAGNOSIS — I639 Cerebral infarction, unspecified: Secondary | ICD-10-CM | POA: Diagnosis not present

## 2023-07-01 DIAGNOSIS — G4733 Obstructive sleep apnea (adult) (pediatric): Secondary | ICD-10-CM | POA: Diagnosis not present

## 2023-07-01 DIAGNOSIS — I340 Nonrheumatic mitral (valve) insufficiency: Secondary | ICD-10-CM | POA: Diagnosis not present

## 2023-07-01 DIAGNOSIS — R079 Chest pain, unspecified: Secondary | ICD-10-CM | POA: Diagnosis not present

## 2023-07-01 DIAGNOSIS — E1169 Type 2 diabetes mellitus with other specified complication: Secondary | ICD-10-CM | POA: Diagnosis not present

## 2023-07-01 DIAGNOSIS — E119 Type 2 diabetes mellitus without complications: Secondary | ICD-10-CM | POA: Diagnosis not present

## 2023-07-01 DIAGNOSIS — R0602 Shortness of breath: Secondary | ICD-10-CM | POA: Diagnosis not present

## 2023-07-01 DIAGNOSIS — R072 Precordial pain: Secondary | ICD-10-CM | POA: Diagnosis not present

## 2023-07-01 DIAGNOSIS — Z6833 Body mass index (BMI) 33.0-33.9, adult: Secondary | ICD-10-CM | POA: Diagnosis not present

## 2023-07-01 DIAGNOSIS — I4891 Unspecified atrial fibrillation: Secondary | ICD-10-CM | POA: Diagnosis not present

## 2023-07-01 DIAGNOSIS — I4892 Unspecified atrial flutter: Secondary | ICD-10-CM | POA: Diagnosis not present

## 2023-07-01 DIAGNOSIS — I10 Essential (primary) hypertension: Secondary | ICD-10-CM | POA: Diagnosis not present

## 2023-07-01 DIAGNOSIS — I209 Angina pectoris, unspecified: Secondary | ICD-10-CM | POA: Diagnosis not present

## 2023-07-01 DIAGNOSIS — R0609 Other forms of dyspnea: Secondary | ICD-10-CM | POA: Diagnosis not present

## 2023-07-04 DIAGNOSIS — I499 Cardiac arrhythmia, unspecified: Secondary | ICD-10-CM | POA: Diagnosis not present

## 2023-07-04 DIAGNOSIS — R0789 Other chest pain: Secondary | ICD-10-CM | POA: Diagnosis not present

## 2023-07-16 DIAGNOSIS — R9439 Abnormal result of other cardiovascular function study: Secondary | ICD-10-CM | POA: Diagnosis not present

## 2023-07-18 DIAGNOSIS — E785 Hyperlipidemia, unspecified: Secondary | ICD-10-CM | POA: Diagnosis not present

## 2023-07-18 DIAGNOSIS — I480 Paroxysmal atrial fibrillation: Secondary | ICD-10-CM | POA: Diagnosis not present

## 2023-07-18 DIAGNOSIS — Z7984 Long term (current) use of oral hypoglycemic drugs: Secondary | ICD-10-CM | POA: Diagnosis not present

## 2023-07-18 DIAGNOSIS — I10 Essential (primary) hypertension: Secondary | ICD-10-CM | POA: Diagnosis not present

## 2023-07-18 DIAGNOSIS — I2510 Atherosclerotic heart disease of native coronary artery without angina pectoris: Secondary | ICD-10-CM | POA: Diagnosis not present

## 2023-07-18 DIAGNOSIS — Z794 Long term (current) use of insulin: Secondary | ICD-10-CM | POA: Diagnosis not present

## 2023-07-18 DIAGNOSIS — E119 Type 2 diabetes mellitus without complications: Secondary | ICD-10-CM | POA: Diagnosis not present

## 2023-07-18 DIAGNOSIS — Z79899 Other long term (current) drug therapy: Secondary | ICD-10-CM | POA: Diagnosis not present

## 2023-07-18 DIAGNOSIS — R9439 Abnormal result of other cardiovascular function study: Secondary | ICD-10-CM | POA: Diagnosis not present

## 2023-07-23 DIAGNOSIS — I25119 Atherosclerotic heart disease of native coronary artery with unspecified angina pectoris: Secondary | ICD-10-CM | POA: Diagnosis not present

## 2023-07-23 DIAGNOSIS — E1140 Type 2 diabetes mellitus with diabetic neuropathy, unspecified: Secondary | ICD-10-CM | POA: Diagnosis not present

## 2023-07-23 DIAGNOSIS — I10 Essential (primary) hypertension: Secondary | ICD-10-CM | POA: Diagnosis not present

## 2023-07-23 DIAGNOSIS — Z299 Encounter for prophylactic measures, unspecified: Secondary | ICD-10-CM | POA: Diagnosis not present

## 2023-07-24 DIAGNOSIS — G4733 Obstructive sleep apnea (adult) (pediatric): Secondary | ICD-10-CM | POA: Diagnosis not present

## 2023-07-24 DIAGNOSIS — R0789 Other chest pain: Secondary | ICD-10-CM | POA: Diagnosis not present

## 2023-07-24 DIAGNOSIS — E119 Type 2 diabetes mellitus without complications: Secondary | ICD-10-CM | POA: Diagnosis not present

## 2023-07-24 DIAGNOSIS — I3489 Other nonrheumatic mitral valve disorders: Secondary | ICD-10-CM | POA: Diagnosis not present

## 2023-07-24 DIAGNOSIS — Z8673 Personal history of transient ischemic attack (TIA), and cerebral infarction without residual deficits: Secondary | ICD-10-CM | POA: Diagnosis not present

## 2023-07-28 DIAGNOSIS — E1165 Type 2 diabetes mellitus with hyperglycemia: Secondary | ICD-10-CM | POA: Diagnosis not present

## 2023-08-15 DIAGNOSIS — I7121 Aneurysm of the ascending aorta, without rupture: Secondary | ICD-10-CM | POA: Diagnosis not present

## 2023-08-15 DIAGNOSIS — I77810 Thoracic aortic ectasia: Secondary | ICD-10-CM | POA: Diagnosis not present

## 2023-08-30 DIAGNOSIS — D179 Benign lipomatous neoplasm, unspecified: Secondary | ICD-10-CM | POA: Diagnosis not present

## 2023-08-30 DIAGNOSIS — I4891 Unspecified atrial fibrillation: Secondary | ICD-10-CM | POA: Diagnosis not present

## 2023-08-30 DIAGNOSIS — I10 Essential (primary) hypertension: Secondary | ICD-10-CM | POA: Diagnosis not present

## 2023-08-30 DIAGNOSIS — Z299 Encounter for prophylactic measures, unspecified: Secondary | ICD-10-CM | POA: Diagnosis not present

## 2023-08-30 DIAGNOSIS — I25119 Atherosclerotic heart disease of native coronary artery with unspecified angina pectoris: Secondary | ICD-10-CM | POA: Diagnosis not present

## 2023-09-04 DIAGNOSIS — F17210 Nicotine dependence, cigarettes, uncomplicated: Secondary | ICD-10-CM | POA: Diagnosis not present

## 2023-09-04 DIAGNOSIS — E119 Type 2 diabetes mellitus without complications: Secondary | ICD-10-CM | POA: Diagnosis not present

## 2023-09-04 DIAGNOSIS — Z823 Family history of stroke: Secondary | ICD-10-CM | POA: Diagnosis not present

## 2023-09-04 DIAGNOSIS — Z136 Encounter for screening for cardiovascular disorders: Secondary | ICD-10-CM | POA: Diagnosis not present

## 2023-09-04 DIAGNOSIS — I10 Essential (primary) hypertension: Secondary | ICD-10-CM | POA: Diagnosis not present

## 2023-09-28 DIAGNOSIS — E1165 Type 2 diabetes mellitus with hyperglycemia: Secondary | ICD-10-CM | POA: Diagnosis not present

## 2023-10-10 DIAGNOSIS — M65311 Trigger thumb, right thumb: Secondary | ICD-10-CM | POA: Diagnosis not present

## 2023-10-28 DIAGNOSIS — E1165 Type 2 diabetes mellitus with hyperglycemia: Secondary | ICD-10-CM | POA: Diagnosis not present

## 2023-11-06 DIAGNOSIS — Z299 Encounter for prophylactic measures, unspecified: Secondary | ICD-10-CM | POA: Diagnosis not present

## 2023-11-06 DIAGNOSIS — U071 COVID-19: Secondary | ICD-10-CM | POA: Diagnosis not present

## 2023-11-06 DIAGNOSIS — R0981 Nasal congestion: Secondary | ICD-10-CM | POA: Diagnosis not present

## 2023-11-06 DIAGNOSIS — I10 Essential (primary) hypertension: Secondary | ICD-10-CM | POA: Diagnosis not present

## 2023-11-06 DIAGNOSIS — R059 Cough, unspecified: Secondary | ICD-10-CM | POA: Diagnosis not present

## 2023-12-28 DIAGNOSIS — E1165 Type 2 diabetes mellitus with hyperglycemia: Secondary | ICD-10-CM | POA: Diagnosis not present

## 2024-04-15 DIAGNOSIS — M65319 Trigger thumb, unspecified thumb: Secondary | ICD-10-CM | POA: Diagnosis not present

## 2024-04-15 DIAGNOSIS — M65321 Trigger finger, right index finger: Secondary | ICD-10-CM | POA: Diagnosis not present
